# Patient Record
Sex: Male | Born: 1970
Health system: Southern US, Community
[De-identification: ages and names within clinical notes are randomized; demographics above are authoritative.]

## PROBLEM LIST (undated history)

## (undated) DIAGNOSIS — R319 Hematuria, unspecified: Secondary | ICD-10-CM

## (undated) HISTORY — PX: NO PAST SURGERIES: SHX2092

---

## 2004-09-11 ENCOUNTER — Emergency Department (HOSPITAL_COMMUNITY): Admission: EM | Admit: 2004-09-11 | Discharge: 2004-09-11 | Payer: Self-pay | Admitting: Emergency Medicine

## 2004-12-15 ENCOUNTER — Emergency Department (HOSPITAL_COMMUNITY): Admission: EM | Admit: 2004-12-15 | Discharge: 2004-12-15 | Payer: Self-pay | Admitting: Emergency Medicine

## 2009-04-20 ENCOUNTER — Emergency Department (HOSPITAL_COMMUNITY): Admission: EM | Admit: 2009-04-20 | Discharge: 2009-04-20 | Payer: Self-pay | Admitting: Emergency Medicine

## 2010-06-22 LAB — COMPREHENSIVE METABOLIC PANEL
ALT: 47 U/L (ref 0–53)
AST: 39 U/L — ABNORMAL HIGH (ref 0–37)
Alkaline Phosphatase: 57 U/L (ref 39–117)
BUN: 17 mg/dL (ref 6–23)
CO2: 24 mEq/L (ref 19–32)
Creatinine, Ser: 1.55 mg/dL — ABNORMAL HIGH (ref 0.4–1.5)
GFR calc Af Amer: >60 mL/min (ref >60)
GFR calc non Af Amer: 50 mL/min — ABNORMAL LOW (ref >60)
Sodium: 136 mEq/L (ref 135–145)
Total Protein: 8.4 g/dL — ABNORMAL HIGH (ref 6.0–8.3)

## 2010-06-22 LAB — DIFFERENTIAL
Basophils Absolute: 0 10*3/uL (ref 0.0–0.1)
Eosinophils Relative: 8 % — ABNORMAL HIGH (ref 0–5)
Lymphocytes Relative: 28 % (ref 12–46)
Monocytes Absolute: 0.2 10*3/uL (ref 0.1–1.0)
Neutrophils Relative %: 58 % (ref 43–77)

## 2010-06-22 LAB — ACETAMINOPHEN LEVEL: Acetaminophen (Tylenol), Serum: <10 ug/mL — ABNORMAL LOW (ref 10–30)

## 2010-06-22 LAB — CBC
HCT: 40.8 % (ref 39.0–52.0)
MCV: 89.8 fL (ref 78.0–100.0)
RBC: 4.54 MIL/uL (ref 4.22–5.81)
WBC: 4.5 10*3/uL (ref 4.0–10.5)

## 2010-06-22 LAB — ETHANOL: Alcohol, Ethyl (B): 249 mg/dL — ABNORMAL HIGH (ref 0–10)

## 2010-06-22 LAB — SALICYLATE LEVEL: Salicylate Lvl: <4 mg/dL (ref 2.8–20.0)

## 2012-05-09 ENCOUNTER — Inpatient Hospital Stay (HOSPITAL_COMMUNITY)
Admission: EM | Admit: 2012-05-09 | Discharge: 2012-05-20 | DRG: 654 | Disposition: A | Discharge status: Home / Self Care | Payer: MEDICAID | Attending: Family Medicine | Admitting: Family Medicine

## 2012-05-09 ENCOUNTER — Encounter (HOSPITAL_COMMUNITY): Payer: Self-pay | Admitting: Emergency Medicine

## 2012-05-09 ENCOUNTER — Observation Stay (HOSPITAL_COMMUNITY): Payer: Self-pay

## 2012-05-09 DIAGNOSIS — R338 Other retention of urine: Secondary | ICD-10-CM

## 2012-05-09 DIAGNOSIS — N390 Urinary tract infection, site not specified: Secondary | ICD-10-CM | POA: Diagnosis present

## 2012-05-09 DIAGNOSIS — K029 Dental caries, unspecified: Secondary | ICD-10-CM | POA: Diagnosis present

## 2012-05-09 DIAGNOSIS — Y838 Other surgical procedures as the cause of abnormal reaction of the patient, or of later complication, without mention of misadventure at the time of the procedure: Secondary | ICD-10-CM | POA: Diagnosis not present

## 2012-05-09 DIAGNOSIS — D62 Acute posthemorrhagic anemia: Secondary | ICD-10-CM | POA: Diagnosis not present

## 2012-05-09 DIAGNOSIS — K567 Ileus, unspecified: Secondary | ICD-10-CM

## 2012-05-09 DIAGNOSIS — N179 Acute kidney failure, unspecified: Secondary | ICD-10-CM | POA: Diagnosis present

## 2012-05-09 DIAGNOSIS — K929 Disease of digestive system, unspecified: Secondary | ICD-10-CM | POA: Diagnosis not present

## 2012-05-09 DIAGNOSIS — R339 Retention of urine, unspecified: Secondary | ICD-10-CM | POA: Diagnosis present

## 2012-05-09 DIAGNOSIS — K9189 Other postprocedural complications and disorders of digestive system: Secondary | ICD-10-CM | POA: Diagnosis not present

## 2012-05-09 DIAGNOSIS — K0381 Cracked tooth: Secondary | ICD-10-CM | POA: Diagnosis present

## 2012-05-09 DIAGNOSIS — R319 Hematuria, unspecified: Secondary | ICD-10-CM

## 2012-05-09 DIAGNOSIS — E876 Hypokalemia: Secondary | ICD-10-CM | POA: Diagnosis not present

## 2012-05-09 DIAGNOSIS — I10 Essential (primary) hypertension: Secondary | ICD-10-CM | POA: Diagnosis present

## 2012-05-09 DIAGNOSIS — R0902 Hypoxemia: Secondary | ICD-10-CM | POA: Diagnosis not present

## 2012-05-09 DIAGNOSIS — Z23 Encounter for immunization: Secondary | ICD-10-CM

## 2012-05-09 DIAGNOSIS — N3289 Other specified disorders of bladder: Principal | ICD-10-CM | POA: Diagnosis present

## 2012-05-09 DIAGNOSIS — N5089 Other specified disorders of the male genital organs: Secondary | ICD-10-CM | POA: Diagnosis not present

## 2012-05-09 DIAGNOSIS — K560 Paralytic ileus: Secondary | ICD-10-CM | POA: Diagnosis not present

## 2012-05-09 DIAGNOSIS — R000 Tachycardia, unspecified: Secondary | ICD-10-CM | POA: Diagnosis not present

## 2012-05-09 HISTORY — DX: Hematuria, unspecified: R31.9

## 2012-05-09 LAB — GRAM STAIN: Special Requests: NORMAL

## 2012-05-09 LAB — CBC WITH DIFFERENTIAL/PLATELET
Basophils Absolute: 0 10*3/uL (ref 0.0–0.1)
HCT: 37.2 % — ABNORMAL LOW (ref 39.0–52.0)
Hemoglobin: 12.9 g/dL — ABNORMAL LOW (ref 13.0–17.0)
MCH: 30 pg (ref 26.0–34.0)
MCHC: 34.7 g/dL (ref 30.0–36.0)
Platelets: 240 10*3/uL (ref 150–400)
RDW: 11.5 % (ref 11.5–15.5)

## 2012-05-09 LAB — URINE MICROSCOPIC-ADD ON

## 2012-05-09 LAB — URINALYSIS, ROUTINE W REFLEX MICROSCOPIC
Glucose, UA: 100 mg/dL — AB
Protein, ur: >300 mg/dL — AB
Specific Gravity, Urine: 1.028 (ref 1.005–1.030)
Specific Gravity, Urine: 1.018 (ref 1.005–1.030)
Urobilinogen, UA: 1 mg/dL (ref 0.0–1.0)
Urobilinogen, UA: 1 mg/dL (ref 0.0–1.0)
pH: 6 (ref 5.0–8.0)

## 2012-05-09 LAB — BASIC METABOLIC PANEL
CO2: 22 mEq/L (ref 19–32)
Calcium: 9.4 mg/dL (ref 8.4–10.5)
Chloride: 94 mEq/L — ABNORMAL LOW (ref 96–112)

## 2012-05-09 LAB — CREATININE, URINE, RANDOM: Creatinine, Urine: 113.78 mg/dL

## 2012-05-09 MED ORDER — OXYCODONE HCL 5 MG PO TABS
5.0000 mg | ORAL_TABLET | ORAL | Status: DC | PRN
  Administered 2012-05-09 – 2012-05-10 (×3): 5 mg via ORAL
  Filled 2012-05-09 (×3): qty 1

## 2012-05-09 MED ORDER — DEXTROSE 5 % IV SOLN
1.0000 g | Freq: Once | INTRAVENOUS | Status: AC
  Administered 2012-05-09: 1 g via INTRAVENOUS
  Filled 2012-05-09: qty 10

## 2012-05-09 MED ORDER — CARVEDILOL 3.125 MG PO TABS
3.1250 mg | ORAL_TABLET | Freq: Two times a day (BID) | ORAL | Status: DC
  Administered 2012-05-10: 3.125 mg via ORAL
  Filled 2012-05-09 (×3): qty 1

## 2012-05-09 MED ORDER — ACETAMINOPHEN 650 MG RE SUPP: 650.0000 mg | Freq: Four times a day (QID) | RECTAL | Status: DC | PRN

## 2012-05-09 MED ORDER — SODIUM CHLORIDE 0.9 % IV BOLUS (SEPSIS)
1000.0000 mL | Freq: Once | INTRAVENOUS | Status: AC
  Administered 2012-05-09: 1000 mL via INTRAVENOUS

## 2012-05-09 MED ORDER — ACETAMINOPHEN 325 MG PO TABS
650.0000 mg | ORAL_TABLET | Freq: Four times a day (QID) | ORAL | Status: DC | PRN
  Administered 2012-05-09: 650 mg via ORAL

## 2012-05-09 MED ORDER — DEXTROSE-NACL 5-0.45 % IV SOLN
INTRAVENOUS | Status: DC
  Administered 2012-05-09 – 2012-05-10 (×3): via INTRAVENOUS

## 2012-05-09 MED ORDER — DEXTROSE 5 % IV SOLN
1.0000 g | INTRAVENOUS | Status: AC
  Administered 2012-05-10 – 2012-05-11 (×2): 1 g via INTRAVENOUS
  Filled 2012-05-09 (×2): qty 10

## 2012-05-09 NOTE — ED Notes (Signed)
Pt transported to US

## 2012-05-09 NOTE — ED Provider Notes (Signed)
Medical screening examination/treatment/procedure(s) were performed by non-physician practitioner and as supervising physician I was immediately available for consultation/collaboration.  Flint Melter, MD 05/09/12 1655

## 2012-05-09 NOTE — H&P (Signed)
Family Medicine Teaching [facility] Admission History and Physical Service Pager: [phone]  Patient name: Adam Wagner Medical record number: [id number] Date of birth: 1970/07/30 Age: 42 y.o. Gender: male  Primary Care Provider: No primary provider on file.  Chief Complaint: "Couldn't pee"  Assessment and Plan: Josel Keo is a 42 y.o. year old male with no reported past medical history presenting with with one day history of urinary retention and found to have grossly bloody urine on cath in ED, additionally with tooth pain and swelling.  1.  AKI: patient with Cr of 3.00. Most recent Cr in system is 1.55 from 3 years ago. Given recent urinary retention must consider obstruction as a cause. Must also consider pre-renal causes with dehydration a possibility. Additionally could be related to intrinsic renal cause especially given proteinuria.  -Urology consulted by the ED physician, will follow-up their recs  -start on D5 1/2 NS at 100 mL/hr for rehydration given ketonuria on UA  -will recheck BMP in the am  -will likely check FeNa to further delineate pre-renal vs intrinsic renal causes   Will obtain at this time however will likely be unreliable, given Gross Blood in Urine, FeNA unreliable in gross hematuria  2.  Gross hematuria: found on cath today. Differential includes traumatic cath vs malignancy vs stone vs stricture vs glomerular cause vs infection.  -will obtain renal US  -will repeat UA, gram stain, and UCx for signs of infection on initial UA  -urology to see, appreciate their recs  -will continue ceftriaxone for concern for infection on UA-will narrow antibiotics with gram stain  and culture results     - reordered UA, Urine Culture and Gram stain  -will follow-up CBC in am given Hgb 12.9-borderline low  3.  Dental: patient with what appears to be a cracked tooth and adjacent swelling concerning for infection.  -will continue ceftriaxone for now, once this is  narrowed for urinary issues, could consider  adding on amoxacillin  -will order orthopantogram to evaluate swelling of left jaw  -will likely need OP dental f/u  4. High blood pressure: no history of HTN per patient, though BP is from 134-172/97-121.  -Will plan to follow BP  -start on coreg 3.125 mg BID  FEN/GI: NPO pending evaluation for further management with urologic intervention, D5 1/2 NS @ 100 mL/hr Prophylaxis: SCD's, no VTE pharmacologic prophylaxis given question of active urinary bleed Disposition: pending urologic evaluation and improved renal function  Code Status: full  History of Present Illness: Adam Wagner is a 42 y.o. year old male with no past medical history presenting with one day history of urinary retention and found to have grossly bloody urine on cath in ED. Patient states started yesterday while at a super bowl party. States he drank 3 beers and as the night went on he couldn't pee. States he tried straining but had pain with this and no urine would come out. States stomach was distended and painful throughout his abdomen. Denies any urinary dysfunction prior to last night. Last urination was Sunday morning he thinks. States has been drinking normally. Denies erectile difficulties, painful erections, painful ejaculation. Denies testicular and scrotal swelling. Denies any chemical exposure.  Additionally patient complains of left sided jaw pain for about one week. States he woke up with this last Monday and has tried salt water swish for this. States can't open mouth all the way due to swelling in left jaw. Not eating normally due to pain associated with this.  There  is no problem list on file for this patient.  Past Medical History: History reviewed. No pertinent past medical history. Past Surgical History: History reviewed. No pertinent past surgical history. Social History: History  Substance Use Topics  . Smoking status: Never Smoker   . Smokeless tobacco:  Not on file  . Alcohol Use: Yes  Lives in [place], works by [employer]  For any additional social history documentation, please refer to relevant sections of EMR.  Family History: Question of HTN, Had 2 aunts with breast cancer, and an uncle with unknown type of cancer. Allergies: No Known Allergies No current facility-administered medications on file prior to encounter.   No current outpatient prescriptions on file prior to encounter.   TAKING IBUPROFEN daily for Tooth Pain over past 3 weeks  Review Of Systems: Per HPI with the following additions: none Otherwise 12 point review of systems was performed and was unremarkable.  Physical Exam: BP 172/118  Pulse 106  Temp 98.4 F (36.9 C) (Oral)  Resp 18  SpO2 97% Exam: General: no acute distress, laying in bed, pleasant HEENT: MMM, left lower jaw posterior tooth with apparent crack in it, associated swelling and tenderness in left jaw at about the equivalent area as tooth Cardiovascular: tachy, reg rhythm, no mrg Respiratory: CTAB, no wheezes or crackles Abdomen: soft, mildly TTP throughout with most tenderness in suprapubic area, no rebound or guarding, no CVA tenderness Extremities: no edema Skin: abrasions over posterior aspect of right hand Neuro: alert, moving all extremities equally  Labs and Imaging:        Results for orders placed during the hospital encounter of 05/09/12 (from the past 24 hour(s))  URINALYSIS, ROUTINE W REFLEX MICROSCOPIC     Status: Abnormal   Collection Time   05/09/12 11:29 AM      Component Value Range   Color, Urine RED (*) YELLOW   APPearance TURBID (*) CLEAR   Specific Gravity, Urine 1.028  1.005 - 1.030   pH 5.5  5.0 - 8.0   Glucose, UA 100 (*) NEGATIVE mg/dL   Hgb urine dipstick LARGE (*) NEGATIVE   Bilirubin Urine LARGE (*) NEGATIVE   Ketones, ur 40 (*) NEGATIVE mg/dL   Protein, ur >409 (*) NEGATIVE mg/dL   Urobilinogen, UA 1.0  0.0 - 1.0 mg/dL   Nitrite POSITIVE  (*) NEGATIVE   Leukocytes, UA LARGE (*) NEGATIVE  URINE MICROSCOPIC-ADD ON     Status: Abnormal   Collection Time   05/09/12 11:29 AM      Component Value Range   Squamous Epithelial / LPF RARE  RARE   WBC, UA 11-20  <3 WBC/hpf   RBC / HPF TOO NUMEROUS TO COUNT  <3 RBC/hpf   Bacteria, UA FEW (*) RARE   Urine-Other LESS THAN 10 mL OF URINE SUBMITTED    CBC WITH DIFFERENTIAL     Status: Abnormal   Collection Time   05/09/12 11:45 AM      Component Value Range   WBC 9.5  4.0 - 10.5 K/uL   RBC 4.30  4.22 - 5.81 MIL/uL   Hemoglobin 12.9 (*) 13.0 - 17.0 g/dL   HCT 81.1 (*) 91.4 - 78.2 %   MCV 86.5  78.0 - 100.0 fL   MCH 30.0  26.0 - 34.0 pg   MCHC 34.7  30.0 - 36.0 g/dL   RDW 95.6  21.3 - 08.6 %   Platelets 240  150 - 400 K/uL   Neutrophils Relative 89 (*) 43 -  77 %   Neutro Abs 8.4 (*) 1.7 - 7.7 K/uL   Lymphocytes Relative 8 (*) 12 - 46 %   Lymphs Abs 0.8  0.7 - 4.0 K/uL   Monocytes Relative 3  3 - 12 %   Monocytes Absolute 0.3  0.1 - 1.0 K/uL   Eosinophils Relative 0  0 - 5 %   Eosinophils Absolute 0.0  0.0 - 0.7 K/uL   Basophils Relative 0  0 - 1 %   Basophils Absolute 0.0  0.0 - 0.1 K/uL  BASIC METABOLIC PANEL     Status: Abnormal   Collection Time   05/09/12 11:45 AM      Component Value Range   Sodium 133 (*) 135 - 145 mEq/L   Potassium 4.3  3.5 - 5.1 mEq/L   Chloride 94 (*) 96 - 112 mEq/L   CO2 22  19 - 32 mEq/L   Glucose, Bld 109 (*) 70 - 99 mg/dL   BUN 22  6 - 23 mg/dL   Creatinine, Ser 2.44 (*) 0.50 - 1.35 mg/dL   Calcium 9.4  8.4 - 01.0 mg/dL   GFR calc non Af Amer 24 (*) >90 mL/min   GFR calc Af Amer 28 (*) >90 mL/min    Marikay Alar, MD 05/09/2012, 4:28 PM   R2 Addendum: Pt seen and examined and agree with Dr. Purvis Sheffield above note with changes made in blue.  Briefly pt is a 42 yo male with no significant medical history who presented with an acute episode of urinary retention.  He underwent an uneventful catherization in the ED and was found to have gross  hematuria.  He was noted to have acute renal injury as well.  No Prior GU complaints.  He does report 3 weeks of L mandibular/tooth pain that he has been taking IBUPROFEN for on a regular basis but otherwise no medications; no reported drug use.    Pt is to be admitted to the hospital for further urologic evaluation and work up of acute renal failure; Imaging and Rocephin >>> Amoxicillin for tooth issues (further w/u as OP).  [place] of abdomen pending and Urine Studies although will likely be unreliable given gross blood.  Will start Coreg for BP given need to avoid nephrotoxic agents. Urology to see.  Andrena Mews, DO [facility] Resident - PGY-2 05/09/2012 6:15 PM

## 2012-05-09 NOTE — ED Notes (Signed)
MD at bedside. 

## 2012-05-09 NOTE — ED Notes (Signed)
Pt states he hasn't urinated in 24 hrs and he feels bloated. Pt states he had a cold and then developed left lower dental pain x1wk.

## 2012-05-09 NOTE — Progress Notes (Signed)
ANTIBIOTIC CONSULT NOTE - INITIAL  Pharmacy Consult for Ceftriaxone Indication: UTI & Dental Infection  No Known Allergies  Vital Signs: Temp: 98.4 F (36.9 C) (02/03 1318) Temp src: Oral (02/03 0947) BP: 172/118 mmHg (02/03 1600) Pulse Rate: 106  (02/03 1600)  Labs:  Basename 05/09/12 1145  WBC 9.5  HGB 12.9*  PLT 240  LABCREA --  CREATININE 3.00*   Estimated Creatinine Clearance: 37.3 ml/min (by C-G formula based on Cr of 3).   Microbiology: Recent Results (from the past 720 hour(s))  GRAM STAIN     Status: Normal   Collection Time   05/09/12  5:26 PM      Component Value Range Status Comment   Specimen Description URINE, RANDOM   Final    Special Requests Normal   Final    Gram Stain     Final    Value: WBC PRESENT,BOTH PMN AND MONONUCLEAR     NEGATIVE FOR BACTERIA     CYTOSPIN SLIDE   Report Status 05/09/2012 FINAL   Final     Assessment: 42 yo male with AKI being started on Ceftriaxone for possible UTI and dental infection. Received Ceftriaxone IV 1g in the ED earlier this afternoon. Scr 3.0, Tmax 98.4'F, WBC 9.5, urine culture sent.  Goal of Therapy:  Clinical improvement   Plan:  1) Ceftriaxone 1g IV  Q24 hours 2) F/u cultures, clinical status, renal function, LOT   Benjaman Pott, PharmD, BCPS 05/09/2012   6:52 PM

## 2012-05-09 NOTE — ED Notes (Signed)
Pt c/o right lower molar tooth pain and facial swelling. ALSO, c/o inability to urinate x 24 hrs.

## 2012-05-09 NOTE — ED Provider Notes (Signed)
History     CSN: [id number]  Arrival date & time 05/09/12  0940   First MD Initiated Contact with Patient 05/09/12 [phone]      Chief Complaint  Patient presents with  . Abscess  . Urinary Retention    (Consider location/radiation/quality/duration/timing/severity/associated sxs/prior treatment) HPI Comments: Patient is a 42 year old male with no significant past medical history who presents with urinary retention for the past 24 hours. Patient reports symptoms started gradually and progressively worsened since the onset. Patient reports drinking fluids normally but feels as though he is unable to urinate. He reports associated abdominal distension and discomfort. He has not tried anything for symptom relief. No aggravating/alleviating factors. Patient denies back pain, fever, NVD.    History reviewed. No pertinent past medical history.  History reviewed. No pertinent past surgical history.  No family history on file.  History  Substance Use Topics  . Smoking status: Never Smoker   . Smokeless tobacco: Not on file  . Alcohol Use: Yes      Review of Systems  Gastrointestinal: Positive for abdominal distention.  Genitourinary: Positive for decreased urine volume.  All other systems reviewed and are negative.    Allergies  Review of patient's allergies indicates no known allergies.  Home Medications   Current Outpatient Rx  Name  Route  Sig  Dispense  Refill  . IBUPROFEN 200 MG PO TABS   Oral   Take 800 mg by mouth every 6 (six) hours as needed. For tooth pain           BP 151/107  Pulse 111  Temp 98.4 F (36.9 C) (Oral)  Resp 18  SpO2 98%  Physical Exam  Nursing note and vitals reviewed. Constitutional: He is oriented to person, place, and time. He appears well-developed and well-nourished. No distress.  HENT:  Head: Normocephalic and atraumatic.  Eyes: Conjunctivae normal and EOM are normal. Pupils are equal, round, and reactive to light. No scleral  icterus.  Neck: Normal range of motion. Neck supple.  Cardiovascular: Normal rate and regular rhythm.  Exam reveals no gallop and no friction rub.   No murmur heard. Pulmonary/Chest: Effort normal and breath sounds normal. He has no wheezes. He has no rales. He exhibits no tenderness.  Abdominal: Soft. He exhibits distension. There is no tenderness. There is no rebound and no guarding.       Suprapubic distension.   Genitourinary: Prostate normal.       Prostate not boggy or tender per rectal exam.   Musculoskeletal: Normal range of motion.  Neurological: He is alert and oriented to person, place, and time. Coordination normal.       Speech is goal-oriented. Moves limbs without ataxia.   Skin: Skin is warm and dry.  Psychiatric: He has a normal mood and affect. His behavior is normal.    ED Course  Procedures (including critical care time)  Labs Reviewed  URINALYSIS, ROUTINE W REFLEX MICROSCOPIC - Abnormal; Notable for the following:    Color, Urine RED (*)  BIOCHEMICALS MAY BE AFFECTED BY COLOR   APPearance TURBID (*)     Glucose, UA 100 (*)     Hgb urine dipstick LARGE (*)     Bilirubin Urine LARGE (*)     Ketones, ur 40 (*)     Protein, ur >300 (*)     Nitrite POSITIVE (*)     Leukocytes, UA LARGE (*)     All other components within normal limits  CBC WITH  DIFFERENTIAL - Abnormal; Notable for the following:    Hemoglobin 12.9 (*)     HCT 37.2 (*)     Neutrophils Relative 89 (*)     Neutro Abs 8.4 (*)     Lymphocytes Relative 8 (*)     All other components within normal limits  BASIC METABOLIC PANEL - Abnormal; Notable for the following:    Sodium 133 (*)     Chloride 94 (*)     Glucose, Bld 109 (*)     Creatinine, Ser 3.00 (*)     GFR calc non Af Amer 24 (*)     GFR calc Af Amer 28 (*)     All other components within normal limits  URINE MICROSCOPIC-ADD ON - Abnormal; Notable for the following:    Bacteria, UA FEW (*)     All other components within normal limits   URINE CULTURE   No results found.   1. UTI (urinary tract infection)   2. Urinary retention       MDM  2:31 PM Foley catheter in place and patient is draining bloody urine. Patient feels some relief from abdominal distension. Urinalysis shows blood and UTI. Patient given 1g Rocephin. Patient has a creatinine of 3. Patient given fluids as well. I will consult urology.   3:11 PM Dr. Margarita Grizzle with Urology will see the patient. Family practice will admit the patient.        Emilia Beck, PA-C 05/09/12 1558

## 2012-05-10 ENCOUNTER — Encounter (HOSPITAL_COMMUNITY)
Admission: EM | Disposition: A | Discharge status: Home / Self Care | Payer: Self-pay | Source: Home / Self Care | Attending: Family Medicine

## 2012-05-10 ENCOUNTER — Inpatient Hospital Stay (HOSPITAL_COMMUNITY): Payer: Self-pay

## 2012-05-10 ENCOUNTER — Encounter (HOSPITAL_COMMUNITY): Payer: Self-pay | Admitting: Anesthesiology

## 2012-05-10 ENCOUNTER — Inpatient Hospital Stay (HOSPITAL_COMMUNITY): Payer: Self-pay | Admitting: Anesthesiology

## 2012-05-10 ENCOUNTER — Encounter (HOSPITAL_COMMUNITY): Payer: Self-pay | Admitting: General Practice

## 2012-05-10 DIAGNOSIS — N3289 Other specified disorders of bladder: Secondary | ICD-10-CM

## 2012-05-10 HISTORY — PX: LAPAROTOMY: SHX154

## 2012-05-10 LAB — CBC
HCT: 31.2 % — ABNORMAL LOW (ref 39.0–52.0)
MCH: 30.7 pg (ref 26.0–34.0)
MCHC: 35.6 g/dL (ref 30.0–36.0)
MCHC: 35.2 g/dL (ref 30.0–36.0)
MCV: 87 fL (ref 78.0–100.0)
Platelets: 235 10*3/uL (ref 150–400)
Platelets: 275 10*3/uL (ref 150–400)
RDW: 11.3 % — ABNORMAL LOW (ref 11.5–15.5)

## 2012-05-10 LAB — URINE CULTURE: Colony Count: NO GROWTH

## 2012-05-10 LAB — BASIC METABOLIC PANEL
BUN: 12 mg/dL (ref 6–23)
BUN: 9 mg/dL (ref 6–23)
CO2: 26 mEq/L (ref 19–32)
Calcium: 9.3 mg/dL (ref 8.4–10.5)
Calcium: 9.7 mg/dL (ref 8.4–10.5)
Chloride: 101 mEq/L (ref 96–112)
Creatinine, Ser: 1.35 mg/dL (ref 0.50–1.35)
GFR calc Af Amer: 73 mL/min — ABNORMAL LOW (ref >90)
GFR calc non Af Amer: 68 mL/min — ABNORMAL LOW (ref >90)
Glucose, Bld: 134 mg/dL — ABNORMAL HIGH (ref 70–99)

## 2012-05-10 LAB — SURGICAL PCR SCREEN
MRSA, PCR: NEGATIVE
Staphylococcus aureus: NEGATIVE

## 2012-05-10 LAB — PROTIME-INR: Prothrombin Time: 14.3 seconds (ref 11.6–15.2)

## 2012-05-10 SURGERY — LAPAROTOMY, EXPLORATORY
Anesthesia: General | Site: Abdomen | Wound class: Dirty or Infected

## 2012-05-10 MED ORDER — ONDANSETRON HCL 4 MG/2ML IJ SOLN
INTRAMUSCULAR | Status: DC | PRN
  Administered 2012-05-10: 4 mg via INTRAVENOUS

## 2012-05-10 MED ORDER — BUPIVACAINE HCL (PF) 0.25 % IJ SOLN
INTRAMUSCULAR | Status: DC | PRN
  Administered 2012-05-10: 30 mL

## 2012-05-10 MED ORDER — ONDANSETRON HCL 4 MG/2ML IJ SOLN
4.0000 mg | INTRAMUSCULAR | Status: DC | PRN
  Administered 2012-05-12 – 2012-05-16 (×3): 4 mg via INTRAVENOUS
  Filled 2012-05-10 (×4): qty 2

## 2012-05-10 MED ORDER — OXYCODONE HCL 5 MG PO TABS
5.0000 mg | ORAL_TABLET | ORAL | Status: DC | PRN
  Administered 2012-05-11 – 2012-05-16 (×3): 10 mg via ORAL
  Filled 2012-05-10 (×3): qty 2

## 2012-05-10 MED ORDER — DIATRIZOATE MEGLUMINE 30 % UR SOLN
Freq: Once | URETHRAL | Status: AC | PRN
  Administered 2012-05-10: 300 mL

## 2012-05-10 MED ORDER — OXYCODONE HCL 5 MG/5ML PO SOLN: 5.0000 mg | Freq: Once | ORAL | Status: DC | PRN

## 2012-05-10 MED ORDER — SODIUM CHLORIDE 0.9 % IV BOLUS (SEPSIS)
1000.0000 mL | Freq: Once | INTRAVENOUS | Status: AC
  Administered 2012-05-10: 1000 mL via INTRAVENOUS

## 2012-05-10 MED ORDER — CIPROFLOXACIN IN D5W 400 MG/200ML IV SOLN
400.0000 mg | Freq: Two times a day (BID) | INTRAVENOUS | Status: DC
  Filled 2012-05-10: qty 200

## 2012-05-10 MED ORDER — MIDAZOLAM HCL 5 MG/5ML IJ SOLN
INTRAMUSCULAR | Status: DC | PRN
  Administered 2012-05-10: 2 mg via INTRAVENOUS

## 2012-05-10 MED ORDER — 0.9 % SODIUM CHLORIDE (POUR BTL) OPTIME
TOPICAL | Status: DC | PRN
  Administered 2012-05-10 (×2): 1000 mL
  Administered 2012-05-10 (×2): 300 mL

## 2012-05-10 MED ORDER — OXYCODONE HCL 5 MG PO TABS: 5.0000 mg | ORAL_TABLET | Freq: Once | ORAL | Status: DC | PRN

## 2012-05-10 MED ORDER — LIDOCAINE HCL (CARDIAC) 20 MG/ML IV SOLN
INTRAVENOUS | Status: DC | PRN
  Administered 2012-05-10: 100 mg via INTRAVENOUS

## 2012-05-10 MED ORDER — IOHEXOL 300 MG/ML  SOLN
80.0000 mL | Freq: Once | INTRAMUSCULAR | Status: AC | PRN
  Administered 2012-05-10: 80 mL via INTRAVENOUS

## 2012-05-10 MED ORDER — CIPROFLOXACIN IN D5W 400 MG/200ML IV SOLN
INTRAVENOUS | Status: DC | PRN
  Administered 2012-05-10: 400 mg via INTRAVENOUS

## 2012-05-10 MED ORDER — ARTIFICIAL TEARS OP OINT
TOPICAL_OINTMENT | OPHTHALMIC | Status: DC | PRN
  Administered 2012-05-10: 1 via OPHTHALMIC

## 2012-05-10 MED ORDER — PHENYLEPHRINE HCL 10 MG/ML IJ SOLN
INTRAMUSCULAR | Status: DC | PRN
  Administered 2012-05-10 (×3): 160 ug via INTRAVENOUS

## 2012-05-10 MED ORDER — DIATRIZOATE MEGLUMINE 30 % UR SOLN: Freq: Once | URETHRAL | Status: DC | PRN

## 2012-05-10 MED ORDER — PROMETHAZINE HCL 25 MG/ML IJ SOLN
6.2500 mg | INTRAMUSCULAR | Status: DC | PRN
  Filled 2012-05-10: qty 1

## 2012-05-10 MED ORDER — TAMSULOSIN HCL 0.4 MG PO CAPS
0.4000 mg | ORAL_CAPSULE | Freq: Every day | ORAL | Status: DC
  Administered 2012-05-10 – 2012-05-20 (×11): 0.4 mg via ORAL
  Filled 2012-05-10 (×12): qty 1

## 2012-05-10 MED ORDER — SODIUM CHLORIDE 0.9 % IV SOLN
INTRAVENOUS | Status: DC
  Administered 2012-05-10 – 2012-05-11 (×2): via INTRAVENOUS
  Administered 2012-05-11: 1000 mL via INTRAVENOUS
  Administered 2012-05-12 – 2012-05-19 (×22): via INTRAVENOUS

## 2012-05-10 MED ORDER — DEXTROSE 5 % IV SOLN
INTRAVENOUS | Status: DC | PRN
  Administered 2012-05-10: 18:00:00 via INTRAVENOUS

## 2012-05-10 MED ORDER — HEPARIN SODIUM (PORCINE) 5000 UNIT/ML IJ SOLN
5000.0000 [IU] | Freq: Three times a day (TID) | INTRAMUSCULAR | Status: DC
  Administered 2012-05-10 – 2012-05-20 (×28): 5000 [IU] via SUBCUTANEOUS
  Filled 2012-05-10 (×32): qty 1

## 2012-05-10 MED ORDER — MORPHINE SULFATE 2 MG/ML IJ SOLN
2.0000 mg | INTRAMUSCULAR | Status: DC | PRN
  Administered 2012-05-11 (×3): 4 mg via INTRAVENOUS
  Administered 2012-05-11: 2 mg via INTRAVENOUS
  Administered 2012-05-11: 4 mg via INTRAVENOUS
  Administered 2012-05-12 (×2): 2 mg via INTRAVENOUS
  Administered 2012-05-13: 4 mg via INTRAVENOUS
  Administered 2012-05-13: 2 mg via INTRAVENOUS
  Administered 2012-05-13 (×3): 4 mg via INTRAVENOUS
  Administered 2012-05-14: 2 mg via INTRAVENOUS
  Administered 2012-05-14: 4 mg via INTRAVENOUS
  Administered 2012-05-14 – 2012-05-15 (×7): 2 mg via INTRAVENOUS
  Filled 2012-05-10: qty 1
  Filled 2012-05-10: qty 2
  Filled 2012-05-10: qty 1
  Filled 2012-05-10: qty 2
  Filled 2012-05-10 (×3): qty 1
  Filled 2012-05-10 (×2): qty 2
  Filled 2012-05-10: qty 1
  Filled 2012-05-10: qty 2
  Filled 2012-05-10 (×2): qty 1
  Filled 2012-05-10 (×3): qty 2
  Filled 2012-05-10 (×4): qty 1
  Filled 2012-05-10 (×2): qty 2

## 2012-05-10 MED ORDER — SUCCINYLCHOLINE CHLORIDE 20 MG/ML IJ SOLN
INTRAMUSCULAR | Status: DC | PRN
  Administered 2012-05-10: 100 mg via INTRAVENOUS

## 2012-05-10 MED ORDER — NEOSTIGMINE METHYLSULFATE 1 MG/ML IJ SOLN
INTRAMUSCULAR | Status: DC | PRN
  Administered 2012-05-10: 3 mg via INTRAVENOUS

## 2012-05-10 MED ORDER — LACTATED RINGERS IV SOLN
INTRAVENOUS | Status: DC | PRN
  Administered 2012-05-10 (×3): via INTRAVENOUS

## 2012-05-10 MED ORDER — SODIUM CHLORIDE 0.9 % IR SOLN
Status: DC | PRN
  Administered 2012-05-10: 3000 mL

## 2012-05-10 MED ORDER — PROPOFOL 10 MG/ML IV BOLUS
INTRAVENOUS | Status: DC | PRN
  Administered 2012-05-10: 200 mg via INTRAVENOUS

## 2012-05-10 MED ORDER — MEPERIDINE HCL 25 MG/ML IJ SOLN: 6.2500 mg | INTRAMUSCULAR | Status: DC | PRN

## 2012-05-10 MED ORDER — ALBUMIN HUMAN 5 % IV SOLN
INTRAVENOUS | Status: DC | PRN
  Administered 2012-05-10: 20:00:00 via INTRAVENOUS

## 2012-05-10 MED ORDER — AMLODIPINE BESYLATE 5 MG PO TABS
5.0000 mg | ORAL_TABLET | Freq: Every day | ORAL | Status: DC
  Administered 2012-05-10: 5 mg via ORAL
  Filled 2012-05-10 (×2): qty 1

## 2012-05-10 MED ORDER — GLYCOPYRROLATE 0.2 MG/ML IJ SOLN
INTRAMUSCULAR | Status: DC | PRN
  Administered 2012-05-10: 0.4 mg via INTRAVENOUS

## 2012-05-10 MED ORDER — HYDROMORPHONE HCL PF 1 MG/ML IJ SOLN
0.2500 mg | INTRAMUSCULAR | Status: DC | PRN
  Administered 2012-05-10 (×2): 0.5 mg via INTRAVENOUS

## 2012-05-10 MED ORDER — ONDANSETRON 8 MG/NS 50 ML IVPB
8.0000 mg | Freq: Once | INTRAVENOUS | Status: AC
  Administered 2012-05-10: 8 mg via INTRAVENOUS
  Filled 2012-05-10: qty 8

## 2012-05-10 MED ORDER — ROCURONIUM BROMIDE 100 MG/10ML IV SOLN
INTRAVENOUS | Status: DC | PRN
  Administered 2012-05-10 (×2): 10 mg via INTRAVENOUS
  Administered 2012-05-10: 20 mg via INTRAVENOUS
  Administered 2012-05-10: 10 mg via INTRAVENOUS
  Administered 2012-05-10: 40 mg via INTRAVENOUS

## 2012-05-10 MED ORDER — FENTANYL CITRATE 0.05 MG/ML IJ SOLN
INTRAMUSCULAR | Status: DC | PRN
  Administered 2012-05-10: 50 ug via INTRAVENOUS
  Administered 2012-05-10: 150 ug via INTRAVENOUS
  Administered 2012-05-10: 50 ug via INTRAVENOUS
  Administered 2012-05-10: 100 ug via INTRAVENOUS
  Administered 2012-05-10: 50 ug via INTRAVENOUS

## 2012-05-10 SURGICAL SUPPLY — 58 items
BAG URO CATCHER STRL LF (DRAPE) ×2 IMPLANT
BLADE SURG ROTATE 9660 (MISCELLANEOUS) IMPLANT
CANISTER SUCTION 2500CC (MISCELLANEOUS) ×2 IMPLANT
CATH FOLEY 2WAY SLVR  5CC 20FR (CATHETERS) ×2
CATH FOLEY 2WAY SLVR 5CC 20FR (CATHETERS) ×2 IMPLANT
CATH URET WHISTLE 5FR 28IN (CATHETERS) ×2 IMPLANT
CHLORAPREP W/TINT 26ML (MISCELLANEOUS) ×2 IMPLANT
CLOTH BEACON ORANGE TIMEOUT ST (SAFETY) ×2 IMPLANT
COVER MAYO STAND STRL (DRAPES) IMPLANT
COVER SURGICAL LIGHT HANDLE (MISCELLANEOUS) ×2 IMPLANT
DRAPE C-ARM 42X72 X-RAY (DRAPES) ×2 IMPLANT
DRAPE LAPAROSCOPIC ABDOMINAL (DRAPES) ×2 IMPLANT
DRAPE UTILITY 15X26 W/TAPE STR (DRAPE) ×4 IMPLANT
DRAPE WARM FLUID 44X44 (DRAPE) ×2 IMPLANT
ELECT BLADE 6.5 EXT (BLADE) ×2 IMPLANT
ELECT CAUTERY BLADE 6.4 (BLADE) ×2 IMPLANT
ELECT REM PT RETURN 9FT ADLT (ELECTROSURGICAL) ×2
ELECTRODE REM PT RTRN 9FT ADLT (ELECTROSURGICAL) ×1 IMPLANT
EVACUATOR SILICONE 100CC (DRAIN) ×2 IMPLANT
GLOVE BIO SURGEON STRL SZ7 (GLOVE) ×6 IMPLANT
GLOVE BIO SURGEON STRL SZ8 (GLOVE) ×2 IMPLANT
GLOVE BIOGEL PI IND STRL 7.5 (GLOVE) ×1 IMPLANT
GLOVE BIOGEL PI IND STRL 8 (GLOVE) ×1 IMPLANT
GLOVE BIOGEL PI INDICATOR 7.5 (GLOVE) ×1
GLOVE BIOGEL PI INDICATOR 8 (GLOVE) ×1
GOWN PREVENTION PLUS XLARGE (GOWN DISPOSABLE) ×2 IMPLANT
GOWN STRL NON-REIN LRG LVL3 (GOWN DISPOSABLE) ×4 IMPLANT
KIT BASIN OR (CUSTOM PROCEDURE TRAY) ×2 IMPLANT
KIT ROOM TURNOVER OR (KITS) ×2 IMPLANT
LIGASURE IMPACT 36 18CM CVD LR (INSTRUMENTS) IMPLANT
NEEDLE 22X1 1/2 (OR ONLY) (NEEDLE) ×2 IMPLANT
NS IRRIG 1000ML POUR BTL (IV SOLUTION) ×4 IMPLANT
PACK GENERAL/GYN (CUSTOM PROCEDURE TRAY) ×2 IMPLANT
PAD ARMBOARD 7.5X6 YLW CONV (MISCELLANEOUS) ×2 IMPLANT
PAD SHARPS MAGNETIC DISPOSAL (MISCELLANEOUS) IMPLANT
SPECIMEN JAR X LARGE (MISCELLANEOUS) IMPLANT
SPONGE GAUZE 4X4 12PLY (GAUZE/BANDAGES/DRESSINGS) ×2 IMPLANT
SPONGE LAP 18X18 X RAY DECT (DISPOSABLE) IMPLANT
STAPLER VISISTAT 35W (STAPLE) ×2 IMPLANT
SUCTION POOLE TIP (SUCTIONS) ×2 IMPLANT
SUT PDS AB 1 TP1 96 (SUTURE) ×6 IMPLANT
SUT SILK 2 0 SH CR/8 (SUTURE) ×2 IMPLANT
SUT SILK 2 0 TIES 10X30 (SUTURE) ×2 IMPLANT
SUT SILK 3 0 SH CR/8 (SUTURE) ×2 IMPLANT
SUT SILK 3 0 TIES 10X30 (SUTURE) ×2 IMPLANT
SUT VIC AB 2-0 SH 27 (SUTURE) ×4
SUT VIC AB 2-0 SH 27X BRD (SUTURE) ×4 IMPLANT
SUT VIC AB 2-0 UR6 27 (SUTURE) ×6 IMPLANT
SUT VIC AB 3-0 SH 27 (SUTURE) ×1
SUT VIC AB 3-0 SH 27XBRD (SUTURE) ×1 IMPLANT
SUT VICRYL 0 UR6 27IN ABS (SUTURE) ×4 IMPLANT
SYR BULB IRRIGATION 50ML (SYRINGE) IMPLANT
SYR CONTROL 10ML LL (SYRINGE) ×2 IMPLANT
TOWEL OR 17X24 6PK STRL BLUE (TOWEL DISPOSABLE) ×2 IMPLANT
TOWEL OR 17X26 10 PK STRL BLUE (TOWEL DISPOSABLE) ×2 IMPLANT
TRAY FOLEY CATH 14FRSI W/METER (CATHETERS) IMPLANT
WATER STERILE IRR 1000ML POUR (IV SOLUTION) IMPLANT
YANKAUER SUCT BULB TIP NO VENT (SUCTIONS) IMPLANT

## 2012-05-10 NOTE — Consult Note (Addendum)
Urology Consult  Requesting provider:  Dr. Mancel Bale. Dr. Doralee Albino  CC: Urinary retention. Gross hematuria.  HPI: 42 year old male presented to the ER with several problems yesterday. He is concerned that he has a tooth abscess and he has not had a bowel movement in about one week.  I have been consulted for gross hematuria. This began yesterday. It followed placement of foley catheter for urinary retention. He has never had gross hematuria before. It is dark, the color of merlot wine. There are no clots in the tubing, but small clots have formed in the collection bag. He states there was no difficulty placing the catheter. He denies a history of smoking, exposure to 2nd hand smoke/chemicals/chemotherapy/rubber/dye. He has no history of prostate cancer or renal cancer. Nothing seems to make his hematuria better or worse. There was concern for infection based on his UA when presenting to the hospital, and a urine culture is pending. Today we discussed that causes for gross hematuria can be benign or malignant. I recommended a hematuria workup. This involves imaging of the upper urinary tract as well as cystoscopy. I explained that, ideally, we would obtain upper tract imaging with contrasted cross-sectional imaging in order to fully evaluate his urinary system. I recommend waiting to see how his kidney function responds to hydration and drainage before obtaining any imaging. If his renal function stabilizes or improves it may be possible to obtain contrasted imaging during this hospitalization. He did improve slowly, he may need to get this as an outpatient. We discussed the risk and benefits of different imaging modalities and specifically the risk of contrast allergy. I explained cystoscopy and how was performed. I recommended that we do this once he is stabilized and his urine has cleared somewhat. We discussed the risk, benefits, and side effects of cystoscopy. I explained that there is no  imaging technique that can replace the detail gained by cystoscopy.  I have also been consulted for urinary retention. This began early yesterday morning. He was able to void on 05/08/12. He denies urgency, frequency, weak stream, or nocturia. Nothing seems to have made the retention better or worse. It was not associated with fever or chills. This has never happened before. A foley catheter was placed and he has had close to 3 liters of urine output since that time. He estimates his period of retention lasted approximately 12 hours.  He has renal failure. It looks like he may have insufficiency at baseline with a serum Cr of 1.5 according to hospital records. Serum Cr on presentation was 3. Renal US yesterday showed no hydronephrosis or renal masses. He states that he has been taking ibuprofen for an extended period (>1 week) for his tooth pain.   PMH: History reviewed. No pertinent past medical history.  PSH: History reviewed. No pertinent past surgical history.  Allergies: No Known Allergies  Medications: Prescriptions prior to admission  Medication Sig Dispense Refill  . ibuprofen (ADVIL,MOTRIN) 200 MG tablet Take 800 mg by mouth every 6 (six) hours as needed. For tooth pain         Social History: History   Social History  . Marital Status: Single    Spouse Name: N/A    Number of Children: N/A  . Years of Education: N/A   Occupational History  . Not on file.   Social History Main Topics  . Smoking status: Never Smoker   . Smokeless tobacco: Not on file  . Alcohol Use: Yes  .  Drug Use: No  . Sexually Active:    Other Topics Concern  . Not on file   Social History Narrative  . No narrative on file    Family History: No family history on file.  Review of Systems: Positive: Constipation, tooth pain. Negative: Fever, chest pain, or SOB.  A further 10 point review of systems was negative except what is listed in the HPI.  Physical Exam: Filed Vitals:   05/10/12  0600  BP: 165/101  Pulse: 100  Temp: 98.8 F (37.1 C)  Resp: 18    General: No acute distress.  Awake. Head:  Normocephalic.  Atraumatic. ENT:  EOMI.  Mucous membranes moist Neck:  Supple.  No lymphadenopathy. CV:  S1 present. S2 present. Regular rate. Pulmonary: Equal effort bilaterally.  Clear to auscultation bilaterally. Abdomen: Soft.  Mildly diffusely tender to palpation. Mildly distended. Negative rebound TTP. Skin:  Normal turgor.  No visible rash. Extremity: No gross deformity of bilateral upper extremities.  No gross deformity of    bilateral lower extremities.  Penis:  Uncircumcised.  No lesions.  Urethra: Foley catheter in place.  Draining tea-merlot colored urine. No blood at meatus. Orthotopic meatus. Scrotum: No lesions.  No ecchymosis.  No erythema. Testicles: Descended bilaterally.  No masses bilaterally. Epididymis: Palpable bilaterally.  Non Tender to palpation.  Studies:  Recent Labs  Basename 05/09/12 1145   HGB 12.9*   WBC 9.5   PLT 240    Recent Labs  Basename 05/09/12 1145   NA 133*   K 4.3   CL 94*   CO2 22   BUN 22   CREATININE 3.00*   CALCIUM 9.4   GFRNONAA 24*   GFRAA 28*     No results found for this basename: PT:2,INR:2,APTT:2 in the last 72 hours   No components found with this basename: ABG:2    Assessment:  Gross hematuria. Urinary retention. Renal failure.  Plan: -Follow up urine cultures. Treat empirically with renally adjusted antibiotics.  -Continue foley catheter. Will start tamsulosin 0.4mg  PO QHS. I discussed the risks, benefits, and side effects with the patient.  -Follow renal function. Possible factors include  ATN from use of NSAIDs, postobstructive, or combination.  -Watch for post-obstructive diuresis. Replace with IV fluids as needed. Consider increasing current IV fluid rate (currently 100 cc/hr).  -If urine begins to clear then we can try voiding trial. If urine continues to stay the same color a void a  voiding trial until it is cleared more.  -Will continue to follow. Please avoid any renal imaging at this point. If his renal function improves we may obtain a contrasted CT scan. If it does not improve but started to trend towards improvement we may delay CT scan as an outpatient so that the optimum imaging can be obtained. I will also schedule him to follow up with me as an outpatient for cystoscopy.   ADDENDUM: Serum creatinine returned at 1.3 which is an improvement. However recommend continued hydration and recheck his creatinine again tomorrow morning. If it remains stable or lower, this should be sufficient to obtain cross sectional contrasted imaging to get a better idea of the source of his bleeding.  Pager: [phone]    CC: Dr. Mancel Bale. Dr. Doralee Albino

## 2012-05-10 NOTE — Anesthesia Procedure Notes (Signed)
Procedure Name: Intubation Date/Time: 05/10/2012 5:40 PM Performed by: Tyrone Nine Pre-anesthesia Checklist: Patient identified, Timeout performed, Suction available, Patient being monitored and Emergency Drugs available Patient Re-evaluated:Patient Re-evaluated prior to inductionOxygen Delivery Method: Circle system utilized Preoxygenation: Pre-oxygenation with 100% oxygen Intubation Type: IV induction and Rapid sequence Ventilation: Mask ventilation without difficulty Grade View: Grade I Number of attempts: 1 Airway Equipment and Method: Video-laryngoscopy and Stylet Placement Confirmation: ETT inserted through vocal cords under direct vision,  positive ETCO2 and breath sounds checked- equal and bilateral Secured at: 23 cm Tube secured with: Tape Dental Injury: Teeth and Oropharynx as per pre-operative assessment  Difficulty Due To: Difficulty was anticipated and Difficult Airway- due to limited oral opening Comments: Video Laryngoscopy utilized due to tooth abscess w/ facial swelling.  Pt was able to open airway 1cm

## 2012-05-10 NOTE — Progress Notes (Signed)
Seen, examined early this morning and discussed in team rounds.  Agree with Dr. Felipa Emory management and documentation.  I am also aware of the CT finding suggestive of ruptured bladder.  Urology aware and we are taking appropriate supportive measures along with preparing for possible surgery.

## 2012-05-10 NOTE — Progress Notes (Signed)
Family Medicine Teaching Service Daily Progress Note Service Page: [phone]  Subjective:  Tooth pain improved with pain meds but continued. Feels like his abdomen is tight and a little tender, compares it to when he pulled a muscle playing football.   Objective: Temp:  [97.2 F (36.2 C)-98.8 F (37.1 C)] 98.8 F (37.1 C) (02/04 0600) Pulse Rate:  [100-115] 100  (02/04 0600) Resp:  [18] 18  (02/04 0600) BP: (134-179)/(97-121) 165/101 mmHg (02/04 0600) SpO2:  [97 %-99 %] 97 % (02/04 0600) Weight:  [220 lb (99.791 kg)] 220 lb (99.791 kg) (02/03 1835) Exam: Gen: NAD, alert, cooperative with exam HEENT: NCAT, EOMI, left lower jaw posterior tooth with apparent crack in it, associated swelling and tenderness in left jaw at about the equivalent area as tooth Neck: tenderness to palpation but no palpable lymph nodes in L cervical chain.  CV: RRR, good S1/S2, no murmur Resp: CTABL, no wheezes, non-labored Abd: tense, tender throughout to palpation but greatest in suprapubic area.  Ext: No edema, warm Neuro: Alert and oriented, No gross deficits   I have reviewed the patient's medications, labs, imaging, and diagnostic testing.  Notable results are summarized below.  CBC BMET   Lab 05/10/12 0540 05/09/12 1145  WBC 7.5 9.5  HGB 11.1* 12.9*  HCT 31.2* 37.2*  PLT 235 240    Lab 05/10/12 0540 05/09/12 1145  NA 136 133*  K 3.6 4.3  CL 101 94*  CO2 26 22  BUN 12 22  CREATININE 1.35 3.00*  GLUCOSE 113* 109*  CALCIUM 9.3 9.4     Imaging/Diagnostic Tests: DG Orthopantogram 05/09/2012 IMPRESSION:  The cracked tooth may involve the posterior left mandibular molar  as the crown appears irregular.  Complete Abd [place] 05/09/2012 IMPRESSION:  Grossly unremarkable kidneys.  Nonvisualization of aorta and pancreas.  Small to moderate amount ascites.  Question of hepatic nodularity was raised by the performing  technologist though this is not definitely confirmed on the  obtained images; if  there is clinical concern for cirrhosis  consider CT imaging with contrast.   Plan: Adam Wagner is a 42 y.o. year old male with no reported past medical history presenting with with one day history of urinary retention and found to have grossly bloody urine on cath in ED, additionally with tooth pain and swelling.   AKI - Cr of 3.00 on admission to 1.35 today . Up from 1.55 3 years ago.  - With recent urinary retention must consider obstruction as a cause. Also consider pre-renal causes with dehydration a possibility and intrinsic renal cause especially given proteinuria, also ATN from NSAID use.  - Urology consulting, appreciate reccs  - Avoid NSAIDs and other nephrotoxic agents as possible - Increase D5 1/2 NS to 125 mL/hr - FeNa 2.6 indicating intrinsic renal pathology, although this may be unreliable in gross hematuria  Gross hematuria:  - New onset after cath in the ED - Differential includes traumatic cath vs malignancy vs stone vs stricture vs glomerular cause vs infection.  - renal US not indicative of obstructive cause  - repeat UA shows positive nitrites, large LE, large Hgb, gram stain neg for bact, UCx pending - urology consulting- appreciate their reccs  - Increased hydration, monitor for post obstructive diuresis,   - Voiding trial if urine clears, start flomax  - CT and cystoscopy depending on renal function improvement.  - With improvement of renal function will consider CT with contrast per urology's request - Continue Rocephin, narrow as cultures return -  Hgb down from 12.9 to 11.1 overnight   Dental pain - cracked tooth and adjacent swelling concerning for infection.  - continue ceftriaxone, once this is narrowed for urinary issues, could consider adding on amoxacillin  - orthopantogram shows possible involvement of posterior L molar - will likely need OP dental f/u  - Added oxycodone IR 5 mg Q6 hrs PRN  High blood pressure:  - no history of HTN per patient  but BP elevated to 134-172/97-121.  - follow BP  - started on coreg 3.125 mg BID   FEN/GI: Regular diet, D5 1/2 NS @ 125 mL/hr  Prophylaxis: SCD's, no VTE pharmacologic prophylaxis given question of active urinary bleed  Disposition: pending urologic evaluation and improved renal function  Code Status: full   Kevin Fenton, MD 05/10/2012, 9:22 AM

## 2012-05-10 NOTE — Op Note (Signed)
Urology Operative Report  Date of Procedure: 05/10/12  Surgeon: Natalia Leatherwood, MD Assistant: Dr. Janee Morn (General Surgery)  Preoperative Diagnosis: Intraperitoneal bladder rupture Postoperative Diagnosis:  Same  Procedure(s): Cystogram Cystoscopy Cystorrhaphy Exploratory laparotomy  Estimated blood loss: 25cc  Specimen: None  Drains: Foley (20Fr), JP drain.  Complications: None  Findings: Intraperitoneal bladder rupture. 3 cm posterior bladder dome bladder rupture. Negative bowel injury. Large amount of clot in the peritoneum and bladder.  History of present illness: 42 year old gentleman who presented to the with urinary retention. Foley catheter was placed with return of bloody urine. During workup for hematuria he was found to have a bladder perforation which was felt to intraperitoneal. After discussing risk and benefits he elected to proceed with exploratory laparotomy and cystoscopy.   Procedure in detail: After informed consent was obtained, the patient was taken to the operating room. They were placed in the supine position. SCDs were turned on and in place. IV antibiotics were infused, and general anesthesia was induced. A timeout was performed in which the correct patient, surgical site, and procedure were identified and agreed upon by the team.  The patient was placed in a dorsolithotomy position, making sure to pad all pertinent neurovascular pressure points. The hair was removed from his anterior abdomen and pelvis. The abdomen from the xiphoid process down to the genitals were prepped and draped in the usual sterile fashion. A 20 French Foley catheter was placed into the urethra and inflated with 10 cc of sterile saline. A plain film was obtained of the pelvis. Next, Cystografin mixed with 100 cc of normal saline to give 400 total cc of fluid was used to fill the bladder by gravity drainage. A second plain film x-ray was obtained showing an intraperitoneal  perforation. The bladder was drained and then a third film was obtained which confirmed intraperitoneal perforation. The Foley catheter was removed and a cystoscope was advanced through the urethra into the bladder. This was a flexible cystoscope. There was too much clot to fully evaluate inside the bladder. I felt that open exploration was indicated.  An incision was made with a 10 blade scalpel from the pubic symphysis to an area approximately 3 cm below the umbilicus. I was able to dissect down to the fascia with Bovie electrocautery. The midline of the rectus muscle was identified and the rectus muscle was split. The fascia was divided down to the pubic symphysis. The space of Retzius was entered. There was no free fluid in the space of Retzius. I then used the Bookwalter self-retaining retractor to retract the walls laterally of the rectus muscle. I then placed 2 stay sutures with a UR 6 Vicryl on either side of the anterior bladder wall. The bladder was then divided with Bovie electrocautery. Once entering the bladder there is a large amount of clot which was evacuated. There was also found to be bowel fat located in the bladder as well. All of the blood clot was evacuated. There was a large 3 cm laceration in the posterior bladder dome. I was able to pack this with a small 2 x 4 and evaluated rest of the bladder. There were no other perforations in the bladder. Both ureter orifices were divided C. to be effluxing clear yellow urine. The patient was given IV indigo carmine. I was able to evaluate the bladder perforation. This had clean edges that were not devascularized. I then closed this in 2 layers first the serosal and muscular layer with a running locking 2-0 Vicryl  suture. I then closed the mucosal layer with a running 3-0 Vicryl. The 2 x 4 was removed before complete closure. Next, I closed the anterior cystotomy in 2 layers using a running locking 2-0 Vicryl for the mucosa and muscular layer. I then  used a 2-0 Vicryl suture for running serosal and muscular layer. A 20 French Foley catheter was placed with 10 cc of sterile water in the balloon. After this there was found to be fluid in the peritoneal cavity. I located the ureters. I placed 2 Coker clamps across the rectus. I then used 0 silk ties to tie proximally and distally and then the rectus was divided with Metzenbaum scissors. The Coker clamps were removed. The anterior peritoneum along the lateral anterior walls was incised with Bovie electric cautery to drop the bladder. There was a large amount of blood clot in the peritoneum. This was removed. I also called Dr. Janee Morn with Gen. surgery to evaluate the bowel and run the bowel with me to ensure there was no injury. The small bowel and cecum and rectum were evaluated and not have any injury. The remaining fluid that could be identified was evacuated. I then placed a 71 [place] Blake drain in the space of Retzius. This was secured with a silk suture. The exit point was separate stab incision in the lower abdomen. I then closed the fascia with a looped #1 PDS. The wound is then irrigated with sterile normal saline. I then closed the superficial tissue with interrupted Vicryl sutures. I injected the wound with 30 cc of quarter percent Marcaine. Staples were then used to close the skin. The drain in the wound were dressed with sterile dressings. The JP drain was hooked to a bulb without suction. The Foley catheter was irrigated with a Toomey syringe and irrigated easily. This is placed to closed drainage by gravity. This completed the procedure. Anesthesia was reversed and the patient was placed back in a supine position. He was taken to the PACU in stable condition.  He will be taken to the step down unit bed tonight. If he does well tomorrow we will likely below be transferred back to the floor. He will be back in the care of the teaching service at [facility].

## 2012-05-10 NOTE — H&P (Signed)
Seen and examined, chart review.  Discussed with Dr. Berline Chough.  Agree with his documentation and management.  Briefly, 42 yo male who awoke with difficulty urinating and abd distention.  Came to ER, found to have a creat=3.0 and a foley was inserted which produced bloody urine with clots.  With overnight hydration, his creat has markedly improved.  Issues: 1. Obstructive uropathy is the cause of his acute kidney injury, acute only or acute on chronic?  Could all be secondary to hematuria with clot causing obstruction. 2. Hematuria.  Need further workup.  Has been taking some NSAIDS for tooth pain (max ibuprofen at single dose= 800 mg and max daily dose = 1600 mg per patient).  Infection is possible.  Neoplasm is in differential   With improved renal function, will obtain contrast CT today.  Also uro on board.  Anticipate cystoscopy in the near future.

## 2012-05-10 NOTE — Anesthesia Preprocedure Evaluation (Addendum)
Anesthesia Evaluation  Patient identified by MRN, date of birth, ID band Patient awake    Airway Mallampati: IV TM Distance: >3 FB   Mouth opening: Limited Mouth Opening Comment: Trismus from tooth abbcess Dental  (+) Teeth Intact   Pulmonary neg pulmonary ROS,  Recent cough, productive   Pulmonary exam normal       Cardiovascular negative cardio ROS  Rhythm:Regular Rate:Normal  10-May-2012 15:39:04 [facility] ROUTINE RECORD Normal sinus rhythm Left ventricular hypertrophy Nonspecific T wave abnormality Abnormal ECG 13mm/s 25mm/mV 100Hz  8.0.1 12SL 241 HD CID: 1 Referred by: BERG SONN   Neuro/Psych negative neurological ROS     GI/Hepatic negative GI ROS, Neg liver ROS,   Endo/Other  negative endocrine ROS  Renal/GU negative Renal ROSHeme in Urine difficulty passing urine.     Musculoskeletal negative musculoskeletal ROS (+)   Abdominal (+)  Abdomen: tender.    Peds  Hematology negative hematology ROS (+)   Anesthesia Other Findings Pt relays Abscess of lower molar Unable to open his mouth, but 1 cm  Reproductive/Obstetrics                        Anesthesia Physical Anesthesia Plan  ASA: I and emergent  Anesthesia Plan: General   Post-op Pain Management:    Induction: Intravenous, Rapid sequence and Cricoid pressure planned  Airway Management Planned: Oral ETT  Additional Equipment:   Intra-op Plan:   Post-operative Plan:   Informed Consent: I have reviewed the patients History and Physical, chart, labs and discussed the procedure including the risks, benefits and alternatives for the proposed anesthesia with the patient or authorized representative who has indicated his/her understanding and acceptance.   Dental advisory given and Dental Advisory Given  Plan Discussed with: CRNA, Anesthesiologist and Surgeon  Anesthesia Plan Comments:         Anesthesia Quick Evaluation

## 2012-05-10 NOTE — Anesthesia Postprocedure Evaluation (Signed)
  Anesthesia Post-op Note  Patient: Adam Wagner  Procedure(s) Performed: Procedure(s) (LRB) with comments: EXPLORATORY LAPAROTOMY (N/A) - exploratory laparotomy for bladder perforation, cystoscopy  Patient Location: PACU  Anesthesia Type:General  Level of Consciousness: awake, alert , oriented and patient cooperative  Airway and Oxygen Therapy: Patient Spontanous Breathing  Post-op Pain: mild  Post-op Assessment: Post-op Vital signs reviewed, Patient's Cardiovascular Status Stable, Respiratory Function Stable, Patent Airway, No signs of Nausea or vomiting and Pain level controlled  Post-op Vital Signs: stable  Complications: No apparent anesthesia complications

## 2012-05-10 NOTE — Progress Notes (Signed)
GU  Patient had CT this afternoon to evaluate for the source of his hematuria. This revealed a possible intraperitoneal bladder perforation. I recommended a cystogram to evaluate for this perforation. In the interim, the patient noted worsening of his abdominal fullness and pain in his abdomen. His blood pressure rose and his pulse pressures narrowed. He is maintaining a mild tachycardia. I reviewed the CT scan and the CT report. I explained these findings to the patient. I discussed with him the possibility of the bladder perforation. I explained to the patient I did not know why he had a perforation of his bladder. We discussed the possibility of trauma earlier today, and he dismissed this. Again he states that he has not had any recent trauma. We discussed the natural history of bladder perforation and I explained that doing nothing could result in death.I have recommended proceeding to the operating room for cystoscopy, cystogram, cystorrhaphy, and exploratory laparotomy. I explained to him the risks, benefits, and alternatives. I do not recommend avoiding surgery at this point, but I did give him the option to proceed with cystogram. We discussed the risk of surgery which include, but are not limited to, bleeding, infection, allergic reaction, heart attack, stroke, pneumonia, death, retinitis, sepsis, need to remain intubated, need for resection of small and/or large bowel, damage to contiguous structures, need for catheterization, need for continued intubation. The patient voiced understanding. At this point he would like to proceed to the operating room without a cystogram. The patient has been typed and crossed for 2 units of packed red blood cells, we have obtained a chest x-ray and an EKG. Coagulation studies obtained and these are normal. The patient's brother, Casimiro Needle, is at the bedside. Their questions were answered to their stated satisfaction.  Filed Vitals:   05/10/12 1616  BP: 179/115  Pulse:  100  Temp: 98.3 F (36.8 C)  Resp: 20   Gen: Awake, alert, in moderate distress. CV: Tachycardic, no murmurs Chest: CTA-B, no crackles Abd: Increased distention, diffusely TTP, negative rebound TTP GU: Foley in place, gross hematuria Ext: No c/c/e  A/P: Possible bladder perforation with continued gross hematuria and worsening abdominal pain. Although he does not have peritoneal signs at this point, I do not think that waiting until these develop would be wise. I obtained informed consent for cystoscopy, cystogram, cystorrhaphy, and exploratory laparotomy.  I have notified Dr. Janee Morn, the general surgeon on call, in case I should need his help.

## 2012-05-10 NOTE — Op Note (Signed)
Intraoperative consult I was asked by Dr. Margarita Grizzle to evaluate this patient in the operating room.Patient has suffered an intraperitoneal bladder rupture of unknown etiology. Dr. Margarita Grizzle has repaired that and asked me to consult in the operating room regarding evaluation of the bowel. I scrubbed in and Dr. Margarita Grizzle already had the abdomen open. His bladder repair appeared nicely intact. There is no evidence of fecal or enteric content free in the peritoneal cavity. I ran the small bowel from the ligament of Treitz down to the terminal ileum. While it was mildly dilated there were no injuries or abnormalities noted. The cecum appeared intact. The rectosigmoid and sigmoid colon were also intact. There were a few small blood clots that came from the bladder but no other abnormalities were noted.  Impression: no evidence of bowel injury. The patient remained in the operating room with Dr. Margarita Grizzle completing his procedure. Violeta Gelinas, MD, MPH, FACS Pager: [phone]

## 2012-05-10 NOTE — Transfer of Care (Signed)
Immediate Anesthesia Transfer of Care Note  Patient: Adam Wagner  Procedure(s) Performed: Procedure(s) (LRB) with comments: EXPLORATORY LAPAROTOMY (N/A) - exploratory laparotomy for bladder perforation, cystoscopy  Patient Location: PACU  Anesthesia Type:General  Level of Consciousness: awake and alert   Airway & Oxygen Therapy: Patient Spontanous Breathing and Patient connected to nasal cannula oxygen  Post-op Assessment: Report given to PACU RN and Post -op Vital signs reviewed and stable  Post vital signs: Reviewed and stable  Complications: No apparent anesthesia complications

## 2012-05-10 NOTE — Preoperative (Signed)
Beta Blockers   Reason not to administer Beta Blockers:Not Applicable 
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
## 2012-05-11 ENCOUNTER — Encounter (HOSPITAL_COMMUNITY): Payer: Self-pay | Admitting: Urology

## 2012-05-11 DIAGNOSIS — R338 Other retention of urine: Secondary | ICD-10-CM

## 2012-05-11 DIAGNOSIS — K029 Dental caries, unspecified: Secondary | ICD-10-CM

## 2012-05-11 DIAGNOSIS — R259 Unspecified abnormal involuntary movements: Secondary | ICD-10-CM

## 2012-05-11 DIAGNOSIS — K560 Paralytic ileus: Secondary | ICD-10-CM

## 2012-05-11 DIAGNOSIS — R319 Hematuria, unspecified: Secondary | ICD-10-CM

## 2012-05-11 DIAGNOSIS — K929 Disease of digestive system, unspecified: Secondary | ICD-10-CM

## 2012-05-11 DIAGNOSIS — K0401 Reversible pulpitis: Secondary | ICD-10-CM

## 2012-05-11 DIAGNOSIS — N3289 Other specified disorders of bladder: Principal | ICD-10-CM

## 2012-05-11 LAB — BASIC METABOLIC PANEL
CO2: 24 mEq/L (ref 19–32)
Calcium: 8.8 mg/dL (ref 8.4–10.5)
GFR calc Af Amer: 75 mL/min — ABNORMAL LOW (ref >90)
GFR calc non Af Amer: 65 mL/min — ABNORMAL LOW (ref >90)
Sodium: 135 mEq/L (ref 135–145)

## 2012-05-11 LAB — CBC
Platelets: 252 10*3/uL (ref 150–400)
RBC: 3.88 MIL/uL — ABNORMAL LOW (ref 4.22–5.81)
WBC: 7.6 10*3/uL (ref 4.0–10.5)

## 2012-05-11 MED ORDER — AMLODIPINE BESYLATE 10 MG PO TABS
10.0000 mg | ORAL_TABLET | Freq: Every day | ORAL | Status: DC
  Administered 2012-05-11 (×2): 5 mg via ORAL
  Administered 2012-05-12 – 2012-05-20 (×9): 10 mg via ORAL
  Filled 2012-05-11 (×11): qty 1

## 2012-05-11 MED ORDER — POLYETHYLENE GLYCOL 3350 17 G PO PACK
17.0000 g | PACK | Freq: Every day | ORAL | Status: DC | PRN
  Filled 2012-05-11: qty 1

## 2012-05-11 MED ORDER — HYDRALAZINE HCL 20 MG/ML IJ SOLN
5.0000 mg | INTRAMUSCULAR | Status: DC | PRN
  Administered 2012-05-11 – 2012-05-13 (×4): 5 mg via INTRAVENOUS
  Filled 2012-05-11 (×4): qty 0.25
  Filled 2012-05-11: qty 1
  Filled 2012-05-11 (×2): qty 0.25

## 2012-05-11 MED ORDER — PENICILLIN V POTASSIUM 250 MG PO TABS
500.0000 mg | ORAL_TABLET | Freq: Four times a day (QID) | ORAL | Status: DC
  Administered 2012-05-11 – 2012-05-13 (×7): 500 mg via ORAL
  Filled 2012-05-11 (×4): qty 2
  Filled 2012-05-11: qty 1
  Filled 2012-05-11: qty 2
  Filled 2012-05-11 (×2): qty 1
  Filled 2012-05-11 (×4): qty 2
  Filled 2012-05-11 (×2): qty 1
  Filled 2012-05-11: qty 2

## 2012-05-11 NOTE — Progress Notes (Signed)
Trauma Service Note  Subjective: Patient doing okay today.  Much less pain.  Urine is dark red with some clots this morning.  Apparently this different from when the Urologist saw the patient.  Objective: Vital signs in last 24 hours: Temp:  [98 F (36.7 C)-99.1 F (37.3 C)] 98.8 F (37.1 C) (02/05 0740) Pulse Rate:  [68-110] 82  (02/05 0740) Resp:  [14-22] 19  (02/05 0740) BP: (131-179)/(82-134) 164/104 mmHg (02/05 0740) SpO2:  [98 %-100 %] 99 % (02/05 0740)    Intake/Output from previous day: 02/04 0701 - 02/05 0700 In: 4400 [I.V.:4150; IV Piggyback:250] Out: 1626 [Urine:1560; Emesis/NG output:1; Drains:40; Blood:25] Intake/Output this shift: Total I/O In: 150 [I.V.:150] Out: -   General: No acute distress.  Patient cannot identify why this may have occurred.  Lungs: Clear  Abd: Distended, has hypoactive bowel sounds.  Extremities: No DVT signs or symptoms  Neuro: Intact  Lab Results: CBC   Basename 05/11/12 0640 05/10/12 1600  WBC 7.6 9.0  HGB 11.7* 13.0  HCT 33.7* 36.9*  PLT 252 275   BMET  Basename 05/11/12 0640 05/10/12 1600  NA 135 135  K 4.0 3.5  CL 101 98  CO2 24 26  GLUCOSE 123* 134*  BUN 10 9  CREATININE 1.33 1.28  CALCIUM 8.8 9.7   PT/INR  Basename 05/10/12 1600  LABPROT 14.3  INR 1.13   ABG No results found for this basename: PHART:2,PCO2:2,PO2:2,HCO3:2 in the last 72 hours  Studies/Results: Ct Abdomen Pelvis W Wo Contrast  05/10/2012  *RADIOLOGY REPORT*  Clinical Data: Gross hematuria, dysuria, abdominal bloating  CT ABDOMEN AND PELVIS WITHOUT AND WITH CONTRAST  Technique:  Multidetector CT imaging of the abdomen and pelvis was performed without contrast material in one or both body regions, followed by contrast material(s) and further sections in one or both body regions.  Contrast: 80mL OMNIPAQUE IOHEXOL 300 MG/ML  SOLN  Comparison: Ultrasound abdomen dated 05/09/2012  Findings: Small right and trace left pleural effusions.   Associated lower lobe opacities, likely atelectasis.  Scattered foci of free abdominal gas below the diaphragm (for example, series 2/images 19 and 29).  Liver, spleen, pancreas, and adrenal glands are within normal limits.  Gallbladder is remarkable, noting pericholecystic fluid/ascites. No intrahepatic or extrahepatic ductal dilatation.  Kidneys are within normal limits.  No renal, ureteral, or bladder calculi.  No hydronephrosis.  Generalized prominence of small and large bowel, suggesting adynamic ileus.  Normal appendix.  No evidence of abdominal aortic aneurysm.  Prostate is unremarkable.  Small volume complex abdominopelvic ascites/hemorrhage.  5.8 x 6.6 x 3.9 cm hematoma superior/posterior to the bladder (series 2/image 83).  Foley catheter with hemorrhage and gas within the bladder.  These findings are worrisome for intraperitoneal bladder rupture.  Delayed imaging through the bladder does not demonstrate active extravasation of excretory contrast.  Mild degenerative changes of the visualized thoracolumbar spine.  IMPRESSION: Hemorrhage within and adjacent to the bladder.  Scattered nondependent foci of free abdominal gas.  While delayed imaging of the bladder does not demonstrate active extravasation of excretory contrast, these findings are still highly suspicious for intraperitoneal bladder rupture.  Suspected secondary adynamic ileus.  These results were called by telephone on 05/10/2012 at 1512 hours to Dr Birdie Sons, who verbally acknowledged these results.   Original Report Authenticated By: Charline Bills, M.D.    Dg Orthopantogram  05/10/2012  *RADIOLOGY REPORT*  Clinical Data: Left-sided jaw pain and swelling.  Cracked tooth.  ORTHOPANTOGRAM/PANORAMIC  Comparison: None.  Findings: The cracked  tooth may involve the posterior left mandibular molar as the crown appears irregular.  No significant peri apical lucency is noted.  Teeth are missing bilaterally.  No findings of osteomyelitis of the  mandible.  If further delineation were clinically desired then CT may be considered.  IMPRESSION: The cracked tooth may involve the posterior left mandibular molar as the crown appears irregular.   Original Report Authenticated By: Lacy Duverney, M.D.    Dg Abd 1 View  05/10/2012  *RADIOLOGY REPORT*  Clinical Data: Bladder perforation.  DG C-ARM 1-60 MIN,ABDOMEN - 1 VIEW  Technique: Four C-arm views are submitted for review after surgery.  Comparison:  05/10/2012 CT.  Findings: With instillation of contrast, there is extravasation consistent with bladder perforation.  Lateral view was not obtained.  IMPRESSION: Findings consistent with bladder perforation.   Original Report Authenticated By: Lacy Duverney, M.D.    US Abdomen Complete  05/09/2012  *RADIOLOGY REPORT*  Clinical Data:  Abdominal distention, gross hematuria, elevated creatinine  ULTRASOUND ABDOMEN:  Technique:  Sonography of upper abdominal structures was performed.  Comparison:  None  Gallbladder:  Normally distended without stones or wall thickening. No pericholecystic fluid or sonographic Murphy sign.  Common bile duct:  Normal caliber 3 mm diameter  Liver:  Grossly normal echogenicity.  No definite focal hepatic mass lesion.  Hepatopetal portal venous flow.  Technologist performing exam felt liver was slightly nodular in appearance though this is not well demonstrated on the obtained images.  IVC:  Normal appearance  Pancreas:  Obscured by bowel gas  Spleen:  Normal appearance, 6.1 cm length  Right kidney:  11.6 cm length. Normal morphology without mass or hydronephrosis.  Left kidney:  10.7 cm length. Normal morphology without mass or hydronephrosis.  Aorta:  Predominately obscured due to bowel gas  Other:  Small to moderate amount of ascites is present.  IMPRESSION: Grossly unremarkable kidneys. Nonvisualization of aorta and pancreas. Small to moderate amount ascites. Question of hepatic nodularity was raised by the performing technologist though  this is not definitely confirmed on the obtained images; if there is clinical concern for cirrhosis consider CT imaging with contrast.   Original Report Authenticated By: Ulyses Southward, M.D.    Dg C-arm 1-60 Min  05/10/2012  *RADIOLOGY REPORT*  Clinical Data: Bladder perforation.  DG C-ARM 1-60 MIN,ABDOMEN - 1 VIEW  Technique: Four C-arm views are submitted for review after surgery.  Comparison:  05/10/2012 CT.  Findings: With instillation of contrast, there is extravasation consistent with bladder perforation.  Lateral view was not obtained.  IMPRESSION: Findings consistent with bladder perforation.   Original Report Authenticated By: Lacy Duverney, M.D.     Anti-infectives: Anti-infectives     Start     Dose/Rate Route Frequency Ordered Stop   05/10/12 2200   ciprofloxacin (CIPRO) IVPB 400 mg  Status:  Discontinued        400 mg 200 mL/hr over 60 Minutes Intravenous Every 12 hours 05/10/12 1813 05/10/12 2237   05/10/12 1300   cefTRIAXone (ROCEPHIN) 1 g in dextrose 5 % 50 mL IVPB        1 g 100 mL/hr over 30 Minutes Intravenous Every 24 hours 05/09/12 1936     05/09/12 1300   cefTRIAXone (ROCEPHIN) 1 g in dextrose 5 % 50 mL IVPB        1 g 100 mL/hr over 30 Minutes Intravenous  Once 05/09/12 1249 05/09/12 1349          Assessment/Plan: s/p Procedure(s): EXPLORATORY LAPAROTOMY  Advance diet Continue foley due to acute urinary retention and bladder perforation recently repaired Sips of clear liquids only.  LOS: 2 days   Marta Lamas. Gae Bon, MD, FACS [phone] Trauma Surgeon 05/11/2012

## 2012-05-11 NOTE — Progress Notes (Signed)
Urology Progress Note  Subjective:     No acute urologic events overnight. The patient is doing well following surgery. He continues to have some hypertension and mild tachycardia. He still has some abdominal distention, but he states his pain is actually improved. Negative flatus or bowel movements. I have encouraged him to ambulate. Catheter draining well with no problems.  I explained the surgical findings and the course of the surgery to the patient. I explained that I do not know the cause of his bladder perforation, but it appears that we caught it early.  ROS: Negative: Chest pain, SOB  Objective:  Patient Vitals for the past 24 hrs:  BP Temp Temp src Pulse Resp SpO2  05/11/12 0606 151/94 mmHg - - 91  17  100 %  05/11/12 0419 154/107 mmHg 98 F (36.7 C) Oral 110  20  98 %  05/11/12 0300 170/106 mmHg - - 92  15  100 %  05/11/12 0200 169/106 mmHg - - 81  15  100 %  05/11/12 0115 155/99 mmHg - - 88  15  99 %  05/11/12 0100 156/107 mmHg - - 92  19  98 %  05/11/12 0006 155/105 mmHg 98.7 F (37.1 C) Oral 106  17  100 %  05/11/12 0000 148/109 mmHg - - 87  15  100 %  05/10/12 2330 147/99 mmHg - - 79  14  100 %  05/10/12 2315 151/99 mmHg - - 68  15  100 %  05/10/12 2300 140/98 mmHg - - 83  17  99 %  05/10/12 2245 152/99 mmHg - - 88  18  99 %  05/10/12 2239 145/87 mmHg - - 78  16  100 %  05/10/12 2238 145/87 mmHg 99.1 F (37.3 C) Oral 71  15  98 %  05/10/12 2233 141/82 mmHg - - - - -  05/10/12 2215 - 98.9 F (37.2 C) - 75  18  100 %  05/10/12 2205 131/82 mmHg - - 71  15  99 %  05/10/12 2200 - - - 100  15  99 %  05/10/12 2150 132/88 mmHg - - 79  15  100 %  05/10/12 2145 - - - 75  21  100 %  05/10/12 2135 141/84 mmHg - - 73  22  99 %  05/10/12 2120 150/88 mmHg 98.6 F (37 C) - 84  17  100 %  05/10/12 1616 179/115 mmHg 98.3 F (36.8 C) Oral 100  20  100 %  05/10/12 1548 150/134 mmHg 98.6 F (37 C) Oral 96  20  100 %    Physical Exam: General:  No acute distress,  awake Cardiovascular:    [x]   S1/S2 present, RRR  []   Irregularly irregular Chest:  CTA-B, no crackles Abdomen:               []  Soft, appropriately TTP  []  Soft, NTTP  [x]  Soft, appropriately TTP, incision(s) dressed with minimal soakage, JP serosanguinous.  Genitourinary: Uncircumcised. Negative edema. Foley:  In position. Draining light pink-clear red without clots.    I/O last 3 completed shifts: In: 2219 [I.V.:2219] Out: 4876 [Urine:4875; Emesis/NG output:1]  Recent Labs  Basename 05/10/12 1600 05/10/12 0540   HGB 13.0 11.1*   WBC 9.0 7.5   PLT 275 235    Recent Labs  Basename 05/10/12 1600 05/10/12 0540   NA 135 136   K 3.5 3.6   CL 98 101   CO2 26 26  BUN 9 12   CREATININE 1.28 1.35   CALCIUM 9.7 9.3   GFRNONAA 68* 63*   GFRAA 78* 73*     Recent Labs  Basename 05/10/12 1600   INR 1.13   APTT 36     No components found with this basename: ABG:2    Length of stay: 2 days.  Assessment: Intraperitoneal bladder perforation. POD#1 Open cystorrhaphy and exploratory laparotomy   Plan: -Labs pending this morning. -Continue JP drain off bulb suction. -Continue foley catheter. -Ambulate with assist. -NPO with sips for meds until he has some return of bowel function. -Continue hydration. -Continue heparin and SCD's. -If he remains hemodynamically stable, he would likely be safe to transfer her back to the floor. -Will continue to follow.   Natalia Leatherwood, MD [phone]

## 2012-05-11 NOTE — Consult Note (Signed)
DENTAL CONSULTATION  Date of Consultation:  05/11/2012 Patient Name:   Adam Wagner Date of Birth:   1970-07-12 Medical Record Number: [id number]  VITALS: BP 148/98  Pulse 125  Temp 98.4 F (36.9 C) (Oral)  Resp 20  Ht 5\' 9"  (1.753 m)  Wt 220 lb (99.791 kg)  BMI 32.49 kg/m2  SpO2 95%   CHIEF COMPLAINT: Dental consultation requested  for evaluation of a cracked tooth.  HPI: Adam Wagner for 42 year old male referred for dental consultation.  Patient presented to the emergency department with a history of urinary retention and history of toothache symptoms involving the lower left molar. Patient subsequently admitted and placed on IV antibiotic therapy.   Dental consultation requested to evaluate lower left molar for treatment as indicated.  Patient provided a history of lower left molar pain for approximately one to 2 weeks. Patient had been in an intensity of 10 out of 10 but is currently 7/10 due to pain medication. Patient describes the pain as being sharp and last hours at a time. Pain with spontaneous as well.  Patient indicates that the pain is much better with current pain medication. Patient indicates he last saw a dentist approximately 2-3 years ago to have 2 teeth pulled by an Transport planner in [place]. Patient is unable to provide the name of the  oral surgeon. Patient does not seek regular dental care by his report.    PMH: Past Medical History  Diagnosis Date  . Hematuria 05/09/2012    PSH: Past Surgical History  Procedure Date  . No past surgeries   . Laparotomy 05/10/2012    Procedure: EXPLORATORY LAPAROTOMY;  Surgeon: Milford Cage, MD;  Location[facility];  Service: Urology;  Laterality: N/A;  exploratory laparotomy for bladder perforation, cystoscopy    ALLERGIES: No Known Allergies  MEDICATIONS: Current Facility-Administered Medications  Medication Dose Route Frequency Provider Last Rate Last Dose  . 0.9 %  sodium chloride infusion   Intravenous  Continuous Andrena Mews, DO 150 mL/hr at 05/11/12 1138 1,000 mL at 05/11/12 1138  . amLODipine (NORVASC) tablet 10 mg  10 mg Oral Daily Elenora Gamma, MD   5 mg at 05/11/12 1138  . heparin injection 5,000 Units  5,000 Units Subcutaneous Q8H Milford Cage, MD   5,000 Units at 05/11/12 1336  . hydrALAZINE (APRESOLINE) injection 5 mg  5 mg Intravenous Q4H PRN Elenora Gamma, MD   5 mg at 05/11/12 1204  . morphine 2 MG/ML injection 2-4 mg  2-4 mg Intravenous Q2H PRN Milford Cage, MD   4 mg at 05/11/12 1451  . ondansetron (ZOFRAN) injection 4 mg  4 mg Intravenous Q4H PRN Milford Cage, MD      . oxyCODONE (Oxy IR/ROXICODONE) immediate release tablet 5-10 mg  5-10 mg Oral Q4H PRN Milford Cage, MD   10 mg at 05/11/12 1025  . penicillin v potassium (VEETID) tablet 500 mg  500 mg Oral Q6H Elenora Gamma, MD      . polyethylene glycol (MIRALAX / GLYCOLAX) packet 17 g  17 g Oral Daily PRN Ardyth Gal, MD      . Tamsulosin HCl [facility]) capsule 0.4 mg  0.4 mg Oral QPC breakfast Milford Cage, MD   0.4 mg at 05/11/12 1022    LABS: Lab Results  Component Value Date   WBC 7.6 05/11/2012   HGB 11.7* 05/11/2012   HCT 33.7* 05/11/2012   MCV 86.9 05/11/2012   PLT 252 05/11/2012  Component Value Date/Time   NA 135 05/11/2012 0640   K 4.0 05/11/2012 0640   CL 101 05/11/2012 0640   CO2 24 05/11/2012 0640   GLUCOSE 123* 05/11/2012 0640   BUN 10 05/11/2012 0640   CREATININE 1.33 05/11/2012 0640   CALCIUM 8.8 05/11/2012 0640   GFRNONAA 65* 05/11/2012 0640   GFRAA 75* 05/11/2012 0640   Lab Results  Component Value Date   INR 1.13 05/10/2012   No results found for this basename: PTT    SOCIAL HISTORY: History   Social History  . Marital Status: Single    Spouse Name: N/A    Number of Children: N/A  . Years of Education: N/A   Occupational History  . Not on file.   Social History Main Topics  . Smoking status: Never Smoker   . Smokeless tobacco: Never Used   . Alcohol Use: Yes     Comment: occassional  . Drug Use: No  . Sexually Active:    Other Topics Concern  . Not on file   Social History Narrative  . No narrative on file    FAMILY HISTORY: History reviewed. No pertinent family history.   REVIEW OF SYSTEMS: Reviewed from chart for this admission.  DENTAL HISTORY: CHIEF COMPLAINT: Dental consultation requested  for evaluation of a cracked tooth.  HPI: Adam Wagner for 42 year old male referred for dental consultation.  Patient presented to the emergency department with a history of urinary retention and history of toothache symptoms involving the lower left molar. Patient subsequently admitted and placed on IV antibiotic therapy.   Dental consultation requested to evaluate lower left molar for treatment as indicated.  Patient provided a history of lower left molar pain for approximately one to 2 weeks. Patient had been in an intensity of 10 out of 10 but is currently 7/10 due to pain medication. Patient describes the pain as being sharp and last hours at a time. Pain with spontaneous as well.  Patient indicates that the pain is much better with current pain medication. Patient indicates he last saw a dentist approximately 2-3 years ago to have 2 teeth pulled by an Transport planner in [place]. Patient is unable to provide the name of the  oral surgeon. Patient does not seek regular dental care by his report.   DENTAL EXAMINATION:  GENERAL: Patient is a well-developed, well built male in no acute distress. HEAD AND NECK: Patient with left neck lymphadenopathy that is tender to palpation. No significant facial swelling is noted at this time. Patient has decreased maximum interincisal opening and trismus. INTRAORAL EXAM: Patient has decreased maximum interincisal opening measured at approximately 15-20 mm. No significant swelling associated with tooth #17.   DENTITION: Patient is missing tooth numbers 13,19, and 30. PERIODONTAL:  Patient has chronic periodontitis with plaque and calculus accumulations, selective areas gingival recession but no significant tooth mobility at this time. DENTAL CARIES/SUBOPTIMAL RESTORATIONS: Patient has extensive dental caries associated with tooth #17 . I would need a full series of dental radiographs to identify the other dental caries that may be present ENDODONTIC: Patient is complaining of acute pulpitis symptoms involving tooth #17. There may be some periapical pathology associate with tooth #17 . CROWN AND BRIDGE: There are no crown or bridge restorations noted. PROSTHODONTIC: The patient has no partial dentures by report OCCLUSION: The patient has a poor occlusal scheme but a stable occlusion at this time.  RADIOGRAPHIC INTERPRETATION: An orthopantogram was obtained and 05/09/2012. There are missing teeth. Dental caries associated  with tooth #17.  There may be some periapical pathology and radiolucency associated with the apices of tooth #17 . There is supra-eruption and drifting of the unopposed teeth into the edentulous areas.   ASSESSMENTS: 1. History of acute pulpitis symptoms from tooth #17 2 . Probable periapical pathology and radiolucency associated with tooth #7 3. Dental caries associated with tooth #17 4. 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 chronic periodontitis 6. Missing teeth 7. Malocclusion 8. Potentially complex extraction procedure due to extensive dental caries, severe trismus, and close proximity of the roots of tooth #17 to the inferior alveolar nerve canal requiring oral surgeon.    PLAN/RECOMMENDATIONS: 1. I discussed the risks, benefits, and complications of various treatment options with the patient in relationship to his medical and dental conditions. The patient will require the services of an oral surgeon to remove tooth #17 due to the complexity of the extraction procedure.  Patient will be maintained on IV antibiotic  therapy and medical team can assist in coordination of followup care with an oral surgeon once medically stable.  Patient also is aware of the need followup with the general dentist for evaluation for other treatment needs as indicated.   2. Discussion of findings with medical team as indicated.   Charlynne Pander, DDS

## 2012-05-11 NOTE — Progress Notes (Signed)
MD made aware of high BP. PRN BP medication ordered.Will continue to monitor

## 2012-05-11 NOTE — Progress Notes (Signed)
Attempt to call report nurse is currently busy will call me back

## 2012-05-11 NOTE — Progress Notes (Signed)
Seen and examined.  Discussed with Dr. Ermalinda Memos.  Agree with his management and documentation.  I am still shaking my head over this bizarre case.

## 2012-05-11 NOTE — Progress Notes (Signed)
GU  Patient noted to continue to have tachycardia and HTN. He feels that his abdomen is more distended this afternoon. Negative nausea or emesis. Positive bladder spasms. Negative fever.  I flushed his catheter in and out with ease. Equivalent volume in and out. Urine remains amber colored. Negative return of clot.  Filed Vitals:   05/11/12 1356  BP: 148/98  Pulse: 125  Temp: 98.4 F (36.9 C)  Resp: 20    Gen: AAM, NAD, Awake Abd: Soft, appropriately TTP, negative rebound TTP, increased distention. Positive bowel sounds. JP serosanguinous with low output. GU: Negative edema. Foley in place. Urine is amber without clot.  Urine output listed as 200cc since 07:00 this morning. There is an additional 420 cc in the collection bag right now, which would give him a urine output of >75cc/hour.  A/P: Intraperitoneal bladder perforation Exploratory laparotomy with cystorrhaphy.  -Continue foley catheter, which seems to be draining appropriately. -Continue IV fluids. -I do not feel that repeat imaging is indicated at this time. His urine is clearing and his has appropriate urine output. His bladder closure was water-tight when checked in the OR, but if his distention continues and continues to have tachycardia and HTN, may need to obtain cystogram to ensure no leakage.

## 2012-05-11 NOTE — Progress Notes (Signed)
Arrived to room 6n26 in bed, a/ox4, oriented to room and surroundings, parents at bedside, denies nausea, c/o abd oain 6/10, encouraged to use ICS.

## 2012-05-11 NOTE — Progress Notes (Signed)
Family Medicine Teaching Service Daily Progress Note Service Page: [phone]  Subjective:  Post op pain managed pretty well with current meds. Notes some tenderness/tenseness in his abdomen. No flatus yet.   Objective: Temp:  [98 F (36.7 C)-99.1 F (37.3 C)] 98.8 F (37.1 C) (02/05 0740) Pulse Rate:  [68-110] 82  (02/05 0740) Resp:  [14-22] 19  (02/05 0740) BP: (131-179)/(82-134) 164/104 mmHg (02/05 0740) SpO2:  [98 %-100 %] 99 % (02/05 0740) Exam: Gen: NAD, alert, cooperative with exam HEENT: NCAT, EOMI, left lower jaw posterior tooth with apparent crack in it CV: RRR, good S1/S2, no murmur Resp: CTABL, no wheezes, non-labored Abd: tender throughout to palpation, Clean dry intact bandage on lower abdomen. JP drain in place. + BS Ext: No edema, warm Neuro: Alert and oriented, No gross deficits   I have reviewed the patient's medications, labs, imaging, and diagnostic testing.  Notable results are summarized below.  CBC BMET   Lab 05/11/12 0640 05/10/12 1600 05/10/12 0540  WBC 7.6 9.0 7.5  HGB 11.7* 13.0 11.1*  HCT 33.7* 36.9* 31.2*  PLT 252 275 235    Lab 05/11/12 0640 05/10/12 1600 05/10/12 0540  NA 135 135 136  K 4.0 3.5 3.6  CL 101 98 101  CO2 24 26 26   BUN 10 9 12   CREATININE 1.33 1.28 1.35  GLUCOSE 123* 134* 113*  CALCIUM 8.8 9.7 9.3     Imaging/Diagnostic Tests: DG Orthopantogram 05/09/2012 IMPRESSION:  The cracked tooth may involve the posterior left mandibular molar  as the crown appears irregular.  Complete Abd [place] 05/09/2012 IMPRESSION:  Grossly unremarkable kidneys.  Nonvisualization of aorta and pancreas.  Small to moderate amount ascites.  Question of hepatic nodularity was raised by the performing  technologist though this is not definitely confirmed on the  obtained images; if there is clinical concern for cirrhosis  consider CT imaging with contrast.   Plan: Adam Wagner is a 42 y.o. year old male with no reported past medical history  presenting with with one day history of urinary retention and found to have grossly bloody urine on cath in ED, additionally with tooth pain and swelling.   AKI - Cr of 3.00 on admission trending down, 1.33 this am - Most likely due to an obstructive cause from and unknown cause - Urology consulting, appreciate reccs  - repeat UA shows positive nitrites, large LE, large Hgb, gram stain neg for bact, UCx pending - Continue Rocephin, narrow as cultures return - Avoid NSAIDs and other nephrotoxic agents as possible - D5 1/2 NS 150 mL/hr - FeNa 2.6 indicating intrinsic renal pathology, although this may be unreliable in gross hematuria  Gross hematuria: Bladder perforation - New onset after cath in the ED, due to bladder perforation now POD 1 from Open cystorrhaphy and exploratory laparotomy - urology consulting- appreciate their reccs - JP drain in place - + BS- sips and chips, advance to clears at lunch if BS continued and has flatus - renal US not indicative of obstructive cause  - Hgb down from 12.9 to 11.7 with two units PRBC peri-op  - Morphine 2-4 mgh IV q2 PRN for pain  Dental pain - cracked tooth, orthopantogram shows possible involvement of posterior L molar - continue ceftriaxone, once this is narrowed for urinary issues, could consider adding on amoxacillin  - IP dental consult for possible extraction- we appreciate their recommendations and assistance.  - oxycodone IR 5 mg Q6 hrs PRN for pain control  High blood pressure:  -  no history of HTN per patient but BP elevated to 134-179/82-115.  - norvasc 10 mg daily, PRN hydralazine with parameters.    FEN/GI: NPO until bowel function returns,  D5 1/2 NS @ 150 mL/hr  Prophylaxis: SCD's, no VTE pharmacologic prophylaxis given question of active urinary bleed  Disposition: step down, likely transfer to med surg this am, home when urologic issues resolved  Code Status: full   Kevin Fenton, MD 05/11/2012, 8:41 AM

## 2012-05-11 NOTE — Progress Notes (Signed)
Report called to [facility] on unit 6 [place]. Patient to be transported via wheelchair.

## 2012-05-12 ENCOUNTER — Inpatient Hospital Stay (HOSPITAL_COMMUNITY): Payer: MEDICAID

## 2012-05-12 ENCOUNTER — Encounter (HOSPITAL_COMMUNITY): Payer: Self-pay

## 2012-05-12 DIAGNOSIS — R000 Tachycardia, unspecified: Secondary | ICD-10-CM

## 2012-05-12 DIAGNOSIS — N390 Urinary tract infection, site not specified: Secondary | ICD-10-CM

## 2012-05-12 LAB — BASIC METABOLIC PANEL
BUN: 13 mg/dL (ref 6–23)
Chloride: 101 mEq/L (ref 96–112)
GFR calc Af Amer: 81 mL/min — ABNORMAL LOW (ref >90)
Glucose, Bld: 95 mg/dL (ref 70–99)
Potassium: 3.8 mEq/L (ref 3.5–5.1)
Sodium: 136 mEq/L (ref 135–145)

## 2012-05-12 LAB — CBC
HCT: 33.2 % — ABNORMAL LOW (ref 39.0–52.0)
Hemoglobin: 11.3 g/dL — ABNORMAL LOW (ref 13.0–17.0)
WBC: 4.3 10*3/uL (ref 4.0–10.5)

## 2012-05-12 MED ORDER — LIDOCAINE HCL 2 % EX GEL
Freq: Once | CUTANEOUS | Status: DC
  Filled 2012-05-12: qty 5

## 2012-05-12 MED ORDER — DOUBLE ANTIBIOTIC 500-10000 UNIT/GM EX OINT
TOPICAL_OINTMENT | Freq: Two times a day (BID) | CUTANEOUS | Status: DC
  Administered 2012-05-12 – 2012-05-19 (×13): via TOPICAL
  Administered 2012-05-19: 1 via TOPICAL
  Administered 2012-05-20: 11:00:00 via TOPICAL
  Filled 2012-05-12 (×2): qty 14.17

## 2012-05-12 MED ORDER — IOHEXOL 300 MG/ML  SOLN
100.0000 mL | Freq: Once | INTRAMUSCULAR | Status: AC | PRN
  Administered 2012-05-12: 100 mL via INTRAVENOUS

## 2012-05-12 MED ORDER — MENTHOL 3 MG MT LOZG: 1.0000 | LOZENGE | OROMUCOSAL | Status: DC | PRN

## 2012-05-12 MED ORDER — METOCLOPRAMIDE HCL 5 MG/ML IJ SOLN
10.0000 mg | Freq: Four times a day (QID) | INTRAMUSCULAR | Status: DC
  Administered 2012-05-12 – 2012-05-17 (×20): 10 mg via INTRAVENOUS
  Filled 2012-05-12 (×24): qty 2

## 2012-05-12 NOTE — Progress Notes (Signed)
Reviewed and discussed in rounds.  Agree with management by Dr. Felipa Emory assessment and plan.
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
## 2012-05-12 NOTE — Progress Notes (Signed)
Urology Progress Note  Subjective:     No acute urologic events overnight. Complains of nausea and abdominal distention. Urine is clearing, but remains amber. Pain well controlled. Patient with tachycardia through most of the day yesterday; he is at a regular rate this morning.   ROS: Positive: sore throat  Objective:  Patient Vitals for the past 24 hrs:  BP Temp Temp src Pulse Resp SpO2  05/11/12 2122 138/87 mmHg 98.6 F (37 C) Oral 107  18  94 %  05/11/12 1846 146/82 mmHg 98.5 F (36.9 C) Oral 119  20  90 %  05/11/12 1425 - - - - - 95 %  05/11/12 1356 148/98 mmHg 98.4 F (36.9 C) Oral 125  20  97 %  05/11/12 1204 175/111 mmHg 98.1 F (36.7 C) Oral 120  22  97 %  05/11/12 0740 164/104 mmHg 98.8 F (37.1 C) Oral 82  19  99 %    Physical Exam: General:  No acute distress, awake Cardiovascular:    [x]   S1/S2 present, RRR  []   Irregularly irregular Chest:  CTA-B, no crackles Abdomen:               []  Soft, appropriately TTP  []  Soft, NTTP  [x]  Soft, appropriately TTP, negative rebound TTP, abdominal distention stable from last night but remains moderate, incision(s) c/d/i, positive blisters that have broken and lost skin coverage, JP serosanguinous.  Genitourinary: Uncircumcised. Negative edema. Foley:  In position. Draining light pink-clear red without clots.    I/O last 3 completed shifts: In: 5750 [P.O.:100; I.V.:5400; IV Piggyback:250] Out: 2151 [Urine:2035; Emesis/NG output:1; Drains:90; Blood:25]  Urine output over the past 24 hours: 950cc  Recent Labs  Basename 05/12/12 0510 05/11/12 0640   HGB 11.3* 11.7*   WBC 4.3 7.6   PLT 267 252    Recent Labs  Basename 05/11/12 0640 05/10/12 1600   NA 135 135   K 4.0 3.5   CL 101 98   CO2 24 26   BUN 10 9   CREATININE 1.33 1.28   CALCIUM 8.8 9.7   GFRNONAA 65* 68*   GFRAA 75* 78*     Recent Labs  Basename 05/10/12 1600   INR 1.13   APTT 36     No components found with this basename: ABG:2    Length  of stay: 3 days.  Assessment: Intraperitoneal bladder perforation. POD#1 Open cystorrhaphy and exploratory laparotomy   Plan: -Renal function is good. He could be third spacing his fluids, but given his recent bladder injury, abdominal distention, and moderate urine output, I feel it is appropriate to obtain another cystogram to ensure there is no leak. I will order this this morning.   -NPO until after CT cystogram is complete.  -Bacitracin ointment and dressing to blistered areas.   Natalia Leatherwood, MD [phone]

## 2012-05-12 NOTE — Progress Notes (Signed)
GU  Patient doing well. I explained the CT cystogram findings, which showed no leak. Urine remains amber with adequate output.  Filed Vitals:   05/12/12 1438  BP: 154/104  Pulse: 112  Temp: 98.2 F (36.8 C)  Resp: 18   Gen: NAD Abd: moderately distended (stable, unchanged), negative rebound TTP, incision c/d/i, JP serosanguinous  A/P: Intraperitoneal bladder rupture s/p open surgical repair. Now with ileus  -I explained to the patient that his ileus may persist for a prolonged time due to the fact that there was urine in his peritoneal cavity.  -Discontinued JP drain today.  -Urine culture negative. Continue rocephin for a total of 5 days, then discontinue (unless it is being used to treat his tooth as well).  -I will see the patient again on Monday. My colleagues will round on the patient in my absence.

## 2012-05-12 NOTE — Progress Notes (Signed)
Having cystogram per Dr. Margarita Grizzle Ileus persists NGT if patient agreeable Reglan Patient examined and I agree with the assessment and plan  Violeta Gelinas, MD, MPH, FACS Pager: [phone]  05/12/2012 8:36 AM

## 2012-05-12 NOTE — Progress Notes (Signed)
Patient ID: Adam Wagner, male   DOB: February 06, 1971, 42 y.o.   MRN: [id number]   LOS: 3 days  POD#2  Subjective: C/o "cold" being in his throat, making him nauseated. No emesis. Feels bloating is about the same. Denies flatus.  Objective: Vital signs in last 24 hours: Temp:  [98.1 F (36.7 C)-98.8 F (37.1 C)] 98.1 F (36.7 C) (02/06 0601) Pulse Rate:  [82-125] 103  (02/06 0601) Resp:  [18-22] 18  (02/06 0601) BP: (138-175)/(82-111) 172/100 mmHg (02/06 0639) SpO2:  [90 %-99 %] 94 % (02/06 0601)    Lab Results:  CBC  Basename 05/12/12 0510 05/11/12 0640  WBC 4.3 7.6  HGB 11.3* 11.7*  HCT 33.2* 33.7*  PLT 267 252   BMET  Basename 05/12/12 0510 05/11/12 0640  NA 136 135  K 3.8 4.0  CL 101 101  CO2 25 24  GLUCOSE 95 123*  BUN 13 10  CREATININE 1.24 1.33  CALCIUM 8.7 8.8    General appearance: alert and no distress Resp: clear to auscultation bilaterally Cardio: regular rate and rhythm GI: Soft, moderate distension, absent BS. Incision C/D/I.   Assessment/Plan: Intraperitoneal bladder rupture Ileus -- Suggested NGT, pt to think about it. Will start prokinetic, continue sips and chips.   Freeman Caldron, PA-C Pager: [phone] General Trauma PA Pager: [phone]   05/12/2012

## 2012-05-13 ENCOUNTER — Inpatient Hospital Stay (HOSPITAL_COMMUNITY): Payer: MEDICAID

## 2012-05-13 DIAGNOSIS — K567 Ileus, unspecified: Secondary | ICD-10-CM | POA: Diagnosis not present

## 2012-05-13 DIAGNOSIS — R338 Other retention of urine: Secondary | ICD-10-CM | POA: Diagnosis present

## 2012-05-13 DIAGNOSIS — N3289 Other specified disorders of bladder: Secondary | ICD-10-CM

## 2012-05-13 DIAGNOSIS — R319 Hematuria, unspecified: Secondary | ICD-10-CM | POA: Diagnosis present

## 2012-05-13 LAB — TYPE AND SCREEN
ABO/RH(D): O POS
Antibody Screen: NEGATIVE
Unit division: 0
Unit division: 0

## 2012-05-13 LAB — BASIC METABOLIC PANEL
BUN: 16 mg/dL (ref 6–23)
CO2: 22 mEq/L (ref 19–32)
GFR calc non Af Amer: >90 mL/min (ref >90)
Glucose, Bld: 123 mg/dL — ABNORMAL HIGH (ref 70–99)
Potassium: 3.8 mEq/L (ref 3.5–5.1)

## 2012-05-13 LAB — CBC
HCT: 31.7 % — ABNORMAL LOW (ref 39.0–52.0)
Hemoglobin: 10.9 g/dL — ABNORMAL LOW (ref 13.0–17.0)
MCH: 30 pg (ref 26.0–34.0)
MCHC: 34.4 g/dL (ref 30.0–36.0)
RBC: 3.63 MIL/uL — ABNORMAL LOW (ref 4.22–5.81)

## 2012-05-13 MED ORDER — DEXTROSE 5 % IV SOLN
1.0000 g | INTRAVENOUS | Status: DC
  Administered 2012-05-13 – 2012-05-15 (×3): 1 g via INTRAVENOUS
  Filled 2012-05-13 (×4): qty 10

## 2012-05-13 MED ORDER — CALCIUM CARBONATE ANTACID 500 MG PO CHEW
2.0000 | CHEWABLE_TABLET | Freq: Once | ORAL | Status: AC
  Administered 2012-05-13: 400 mg via ORAL
  Filled 2012-05-13: qty 2

## 2012-05-13 MED ORDER — ATENOLOL 50 MG PO TABS
50.0000 mg | ORAL_TABLET | Freq: Every day | ORAL | Status: DC
  Administered 2012-05-13: 50 mg via ORAL
  Filled 2012-05-13 (×2): qty 1

## 2012-05-13 NOTE — Progress Notes (Signed)
Family Medicine Teaching Service Daily Progress Note Service Page: [phone]  Subjective:  Post op pain managed pretty well with current meds. Abdomen feels tense. Did get up to walk 2 x yesterday. Toleratiing water and ice well, no flatus.   Objective: Temp:  [97.8 F (36.6 C)-98.7 F (37.1 C)] 98.1 F (36.7 C) (02/07 0621) Pulse Rate:  [107-129] 122  (02/07 0621) Resp:  [18] 18  (02/07 0621) BP: (141-170)/(97-120) 141/108 mmHg (02/07 0621) SpO2:  [93 %-97 %] 93 % (02/07 4540) Exam: Gen: NAD, alert, cooperative with exam HEENT: NCAT, EOMI, left lower jaw posterior tooth with apparent crack in it CV: RRR, good S1/S2, no murmur Resp: CTABL, no wheezes, non-labored Abd: mildly tender throughout to palpation, Clean dry intact wound on lower abdomen, bandages Clean dry and intact X 2 oin RU and LU quads. + BS but hypoactive Ext: No edema, warm Neuro: Alert and oriented, No gross deficits   I have reviewed the patient's medications, labs, imaging, and diagnostic testing.  Notable results are summarized below.  CBC BMET   Lab 05/12/12 0510 05/11/12 0640 05/10/12 1600  WBC 4.3 7.6 9.0  HGB 11.3* 11.7* 13.0  HCT 33.2* 33.7* 36.9*  PLT 267 252 275    Lab 05/12/12 0510 05/11/12 0640 05/10/12 1600  NA 136 135 135  K 3.8 4.0 3.5  CL 101 101 98  CO2 25 24 26   BUN 13 10 9   CREATININE 1.24 1.33 1.28  GLUCOSE 95 123* 134*  CALCIUM 8.7 8.8 9.7     Imaging/Diagnostic Tests: DG Orthopantogram 05/09/2012 IMPRESSION:  The cracked tooth may involve the posterior left mandibular molar  as the crown appears irregular.  Complete Abd [place] 05/09/2012 IMPRESSION:  Grossly unremarkable kidneys.  Nonvisualization of aorta and pancreas.  Small to moderate amount ascites.  Question of hepatic nodularity was raised by the performing  technologist though this is not definitely confirmed on the  obtained images; if there is clinical concern for cirrhosis  consider CT imaging with contrast.    Plan: Devantae Babe is a 42 y.o. year old male with no reported past medical history presenting with with one day history of urinary retention and found to have grossly bloody urine on cath in ED, additionally with tooth pain and swelling.   Gross hematuria: Bladder perforation - New onset after cath in the ED, due to bladder perforation now POD 3 from Open cystorrhaphy and exploratory laparotomy - urology consulting- appreciate their reccs  - Repeat cystogram without evidence of bladder leakage  - Post op ileus may remain for a few days considering he had urine in his abdomen  - Restarted rocephin-will continue 2 mored days, then back to [facility] for tooth - General surgery consulting- appreciate reccs  - Agree with reglan and NG tube, but patient agrees to defer until he is nauseated and/or vomiting - JP drain d/c'd this am.  - renal US not indicative of obstructive cause- started flomax  - Hgb down from 12.9 to 11.3 with two units PRBC peri-op , pending this am.  - Morphine 2-4 mgh IV q2 PRN for pain, 5-10 oxycodone Q4 prn  Post Op ileus - Hypoactive BS, continue sips and chips for now - He's agreeable for NG tube id he developes emesis - Started reglan - Gen surg following- appreciate reccs  AKI - Cr of 3.00 on admission trending down, 1.24 on 2/6 - Most likely due to an obstructive cause from and unknown cause - Urology consulting, appreciate  reccs  - repeat UA shows positive nitrites, large LE, large Hgb, gram stain neg for bact, UCx pending - Rocephin Times 3, now on [facility] VK for dental concerns - on Flomax - Avoid NSAIDs and other nephrotoxic agents as possible - NS 150 mL/hr  Dental pain- acute pulpitis and dental carie of tooth #17 - IP dentist consulting- appreciate reccs  - Will need oral surgeon to remove tooth- OMFS not IP so will refer OP.  - cracked tooth, orthopantogram shows possible involvement of posterior L molar - now rocephin for 2 more days, will add back  p[facility] v k after that - pain control per above  High blood pressure: with tachycardia - no history of HTN per patient but BP elevated to 138-175/100-111.  - Tachycardia 107-129 - norvasc 10 mg daily, PRN hydralazine with parameters.  - atenolol ordered/added  FEN/GI: sips an dchips until bowel function returns,  NS @ 150 mL/hr  Prophylaxis: SCD's, no VTE pharmacologic prophylaxis given question of active urinary bleed  Disposition: med surg, home when urologic issues resolved  Code Status: full   Kevin Fenton, MD 05/13/2012, 7:18 AM

## 2012-05-13 NOTE — Progress Notes (Addendum)
Still has some ileus but +BS today.  CXR with BB ATX - I encouraged pulmonary toilet. WBC WNL yesterday, recheck labs. Patient examined and I agree with the assessment and plan  Violeta Gelinas, MD, MPH, FACS Pager: [phone]  05/13/2012 9:39 AM

## 2012-05-13 NOTE — Progress Notes (Signed)
Patient ID: Adam Wagner, male   DOB: Nov 02, 1970, 42 y.o.   MRN: [id number]   LOS: 4 days  POD#3  Subjective: NSC since yesterday. Only new c/o is some "spit" in his throat, maybe a little regurg with hiccups. Denies CP, SOB, flatus. Pain about the same.  Objective: Vital signs in last 24 hours: Temp:  [97.8 F (36.6 C)-98.7 F (37.1 C)] 98.1 F (36.7 C) (02/07 0621) Pulse Rate:  [107-129] 122  (02/07 0621) Resp:  [18] 18  (02/07 0621) BP: (141-170)/(97-120) 141/108 mmHg (02/07 0621) SpO2:  [93 %-97 %] 93 % (02/07 0621)    General appearance: alert and no distress Resp: clear to auscultation bilaterally Cardio: Tachycardia GI: Soft, +BS. Moderate distension. Incision C/D/I. Appropriate TTP. Extremities: no edema, redness or tenderness in the calves or thighs Pulses: 2+ and symmetric   Assessment/Plan: Intraperitoneal bladder rupture Ileus -- Continue prokinetic, continue sips and chips. Bowel sounds increased so may be starting to resolve. Tachycardia, HTN, hypoxia -- HR had increased yesterday into the 100's, now in 120's. Continues with HTN. Will add beta blocker to regimen. I see no e/o DVT and though O2 sats are lower than I would expect at times they don't seem appreciably different. No fever to indicate infection but will get CBC. Check CXR.    Freeman Caldron, PA-C Pager: [phone] General Trauma PA Pager: [phone]   05/13/2012

## 2012-05-13 NOTE — Progress Notes (Signed)
Seen, examined and discussed in rounds.  Agree with Dr. Bradshaw's documentation and management. 

## 2012-05-13 NOTE — Progress Notes (Signed)
Post op bladder repair HR and BP elevated Ileus as noted Good urine output Last labs good Foley draining Will observe  And follow Cystogram yesterday normal

## 2012-05-14 LAB — BASIC METABOLIC PANEL
BUN: 17 mg/dL (ref 6–23)
CO2: 25 mEq/L (ref 19–32)
Calcium: 8.8 mg/dL (ref 8.4–10.5)
Creatinine, Ser: 0.91 mg/dL (ref 0.50–1.35)
GFR calc non Af Amer: >90 mL/min (ref >90)
Glucose, Bld: 93 mg/dL (ref 70–99)
Sodium: 134 mEq/L — ABNORMAL LOW (ref 135–145)

## 2012-05-14 LAB — CBC
Hemoglobin: 10.5 g/dL — ABNORMAL LOW (ref 13.0–17.0)
MCH: 29.9 pg (ref 26.0–34.0)
MCHC: 34.1 g/dL (ref 30.0–36.0)
MCV: 87.7 fL (ref 78.0–100.0)
RBC: 3.51 MIL/uL — ABNORMAL LOW (ref 4.22–5.81)

## 2012-05-14 MED ORDER — METOPROLOL TARTRATE 25 MG PO TABS
25.0000 mg | ORAL_TABLET | Freq: Two times a day (BID) | ORAL | Status: DC
  Administered 2012-05-14 – 2012-05-15 (×3): 25 mg via ORAL
  Filled 2012-05-14 (×4): qty 1

## 2012-05-14 MED ORDER — PANTOPRAZOLE SODIUM 40 MG PO TBEC
40.0000 mg | DELAYED_RELEASE_TABLET | Freq: Every day | ORAL | Status: DC
  Administered 2012-05-14 – 2012-05-19 (×6): 40 mg via ORAL
  Filled 2012-05-14 (×6): qty 1

## 2012-05-14 NOTE — Progress Notes (Signed)
4 Days Post-Op  Subjective: Pt con't with abd distention.  No flatus or BM.    Objective: Vital signs in last 24 hours: Temp:  [97.9 F (36.6 C)-98.4 F (36.9 C)] 98.4 F (36.9 C) (02/08 0546) Pulse Rate:  [98-114] 98 (02/08 0546) Resp:  [18-20] 18 (02/08 0546) BP: (134-154)/(82-102) 154/95 mmHg (02/08 0546) SpO2:  [93 %-96 %] 95 % (02/08 0546)    Intake/Output from previous day: 02/07 0701 - 02/08 0700 In: 2407.5 [I.V.:2407.5] Out: 1175 [Urine:1175] Intake/Output this shift:    General appearance: alert and cooperative GI: distended, bowel sounds, no rebound / guarding  Lab Results:   Recent Labs  05/13/12 0915 05/14/12 0540  WBC 3.0* 2.2*  HGB 10.9* 10.5*  HCT 31.7* 30.8*  PLT 300 301   BMET  Recent Labs  05/13/12 0915 05/14/12 0540  NA 135 134*  K 3.8 3.5  CL 102 101  CO2 22 25  GLUCOSE 123* 93  BUN 16 17  CREATININE 0.95 0.91  CALCIUM 8.7 8.8   PT/INR No results found for this basename: LABPROT, INR,  in the last 72 hours ABG No results found for this basename: PHART, PCO2, PO2, HCO3,  in the last 72 hours  Studies/Results: Dg Chest 2 View  05/13/2012  *RADIOLOGY REPORT*  Clinical Data: Hypoxia.  History of exploratory laparotomy 3 days ago  CHEST - 2 VIEW  Comparison: None.  Findings: Low lung volumes are present.  The patient is in a semi- erect position and this combined with low lung volumes accentuates the cardiac contour but this is felt likely to be within normal limits.  Ectasia of the thoracic aorta is suggested but may also be accentuated by positioning.  The lung fields appear clear with the exception of crowding of bronchovascular markings at both lung bases due to low lung volumes.  No pleural fluid or focal infiltrates are seen.  No pulmonary vascular congestion or pulmonary edema is noted.  The visualized portion of the bowel gas pattern reveals several mildly dilated bowel loops in the upper abdomen and air fluid levels on the lateral  view. This can be more fully evaluated with an abdominal series if felt clinically indicated.  IMPRESSION: Low lung volumes with crowding of bronchovascular markings at the lung bases and no worrisome focal or acute abnormality suggested.  Mildly dilated small bowel loops with air fluid levels suggested in the visualized portion of the abdomen. Full evaluation if indicated clinically can be obtained with flat plate and upright views of the abdomen.   Original Report Authenticated By: Rhodia Albright, M.D.    Ct Pelvis W Contrast  05/12/2012  *RADIOLOGY REPORT*  Clinical Data:  Status post bladder rupture with recent repair.  CT PELVIS WITH CONTRAST  Technique:  Multidetector CT imaging of the pelvis was performed using the standard protocol following the bolus administration of intravenous contrast.  Contrast: OMNIPAQUE IOHEXOL 300 MG/ML  SOLN  Comparison:  CT scan 05/10/2012 and cystogram 05/10/2012  Findings:  Contrast is distended the bladder.  The bladder wall is mildly irregular but no active extravasation of contrast is demonstrated to suggest a persistent leak.  The distal ureters appear normal.  There is a small amount of persistent high-density free pelvic fluid.  Persistent dilated small bowel and colon.  There is a small amount of residual extra peritoneal air likely related to recent surgery.  IMPRESSION: No CT findings for persistent bladder leak.   Original Report Authenticated By: Rudie Meyer, M.D.  Anti-infectives: Anti-infectives   Start     Dose/Rate Route Frequency Ordered Stop   05/13/12 0800  cefTRIAXone (ROCEPHIN) 1 g in dextrose 5 % 50 mL IVPB     1 g 100 mL/hr over 30 Minutes Intravenous Every 24 hours 05/13/12 0719     05/11/12 1700  penicillin v potassium (VEETID) tablet 500 mg  Status:  Discontinued     500 mg Oral 4 times per day 05/11/12 1140 05/13/12 0731   05/10/12 2200  ciprofloxacin (CIPRO) IVPB 400 mg  Status:  Discontinued     400 mg 200 mL/hr over 60  Minutes Intravenous Every 12 hours 05/10/12 1813 05/10/12 2237   05/10/12 1300  cefTRIAXone (ROCEPHIN) 1 g in dextrose 5 % 50 mL IVPB     1 g 100 mL/hr over 30 Minutes Intravenous Every 24 hours 05/09/12 1936 05/11/12 1406   05/09/12 1300  cefTRIAXone (ROCEPHIN) 1 g in dextrose 5 % 50 mL IVPB     1 g 100 mL/hr over 30 Minutes Intravenous  Once 05/09/12 1249 05/09/12 1349      Assessment/Plan: s/p Procedure(s) with comments: exploratory laparotomy for bladder perforation, cystoscopy (N/A) - exploratory laparotomy for bladder perforation, cystoscopy Pt con't with ileus Con't CLD at this time and wait for bowel function to return, con't Reglan Mobilize   LOS: 5 days    Marigene Ehlers., [facility] 05/14/2012

## 2012-05-14 NOTE — Progress Notes (Signed)
Family Medicine Teaching Service Daily Progress Note Service Page: [phone]  Subjective:  Post op pain managed pretty well with current meds. Abdomen feels tense, but feels like he is going to pass some gas.  Had hearburn overnight, tums helped.   Objective: Temp:  [97.9 F (36.6 C)-98.4 F (36.9 C)] 98.4 F (36.9 C) (02/08 0546) Pulse Rate:  [98-114] 98 (02/08 0546) Resp:  [18-20] 18 (02/08 0546) BP: (134-154)/(82-102) 154/95 mmHg (02/08 0546) SpO2:  [93 %-96 %] 95 % (02/08 0546) Exam: Gen: NAD, alert, cooperative with exam HEENT: NCAT, EOMI, left lower jaw posterior tooth with apparent crack in it CV: RRR, good S1/S2, no murmur Resp: CTABL, no wheezes, non-labored Abd: mildly tender and distended, Clean dry intact wound on lower abdomen, bandages Clean dry and intact X 2 oin RU and LU quads. + BS but hypoactive Ext: No edema, warm Neuro: Alert and oriented, No gross deficits   I have reviewed the patient's medications, labs, imaging, and diagnostic testing.  Notable results are summarized below.  CBC BMET   Recent Labs Lab 05/12/12 0510 05/13/12 0915 05/14/12 0540  WBC 4.3 3.0* 2.2*  HGB 11.3* 10.9* 10.5*  HCT 33.2* 31.7* 30.8*  PLT 267 300 301    Recent Labs Lab 05/12/12 0510 05/13/12 0915 05/14/12 0540  NA 136 135 134*  K 3.8 3.8 3.5  CL 101 102 101  CO2 25 22 25   BUN 13 16 17   CREATININE 1.24 0.95 0.91  GLUCOSE 95 123* 93  CALCIUM 8.7 8.7 8.8     Imaging/Diagnostic Tests: DG Orthopantogram 05/09/2012 IMPRESSION:  The cracked tooth may involve the posterior left mandibular molar  as the crown appears irregular.  Complete Abd [place] 05/09/2012 IMPRESSION:  Grossly unremarkable kidneys.  Nonvisualization of aorta and pancreas.  Small to moderate amount ascites.  Question of hepatic nodularity was raised by the performing  technologist though this is not definitely confirmed on the  obtained images; if there is clinical concern for cirrhosis  consider CT  imaging with contrast.   Plan: Adam Wagner is a 42 y.o. year old male with no reported past medical history presenting with with one day history of urinary retention and found to have grossly bloody urine on cath in ED, additionally with tooth pain and swelling.   Gross hematuria: Bladder perforation - New onset after cath in the ED, due to bladder perforation now POD 3 from Open cystorrhaphy and exploratory laparotomy - urology consulting- appreciate their reccs  - Repeat cystogram without evidence of bladder leakage  - Post op ileus continues, discussed ambulation with patient  - Restarted rocephin-will continue 2 mored days, then back to penn v k for tooth - General surgery consulting- appreciate reccs  - Agree with reglan and NG tube, but patient agrees to defer until he is nauseated and/or vomiting - Hgb stable 10.5 this am - Morphine 2-4 mgh IV q2 PRN for pain, 5-10 oxycodone Q4 prn  Post Op ileus - Hypoactive BS, continue sips and chips for now - Hold off on NGT for now (no vomiting) - Started reglan - Gen surg following- appreciate reccs - Ambulate  AKI - Cr of 3.00 on admission, now normalzing - Urology consulting, appreciate reccs  - repeat UA shows positive nitrites, large LE, large Hgb, gram stain neg for bact, UCx pending - Rocephin  - on Flomax - Avoid NSAIDs and other nephrotoxic agents as possible - NS 150 mL/hr  Dental pain- acute pulpitis and dental carie of tooth #17 -  IP dentist consulting- appreciate reccs  - Will need oral surgeon to remove tooth- OMFS not IP so will refer OP.  - cracked tooth, orthopantogram shows possible involvement of posterior L molar - now rocephin for 2 more days, will add back penn v k after that - pain control per above  High blood pressure: with tachycardia - no history of HTN per patient but BP elevated to 138-175/100-111.  - Tachycardia 107-129 - norvasc 10 mg daily, PRN hydralazine with parameters.  - will change  atenolol to metoprolol for better pulse and BP control  FEN/GI: sips an dchips until bowel function returns,  NS @ 150 mL/hr  Prophylaxis: SCD's, no VTE pharmacologic prophylaxis given question of active urinary bleed  Disposition: pending improvement of post-op ileus, tolerating PO, pain control  Code Status: full   Adam Fatzinger, MD 05/14/2012, 7:02 AM

## 2012-05-14 NOTE — Progress Notes (Signed)
4 Days Post-Op Subjective: Patient reports passing flatus and pain control good. He said he has not had a bowel movement but does report possibly passing a small amount of gas. He is not having any nausea or vomiting. His abdomen remains distended. He has been sitting on a small amount of water and sucking on ice chips to keep his mouth from getting to dry. He also reports some mild dyspepsia.  Objective: Vital signs in last 24 hours: Temp:  [97.9 F (36.6 C)-98.4 F (36.9 C)] 98.4 F (36.9 C) (02/08 0546) Pulse Rate:  [98-114] 98 (02/08 0546) Resp:  [18-20] 18 (02/08 0546) BP: (134-154)/(82-102) 154/95 mmHg (02/08 0546) SpO2:  [93 %-96 %] 95 % (02/08 0546)  Intake/Output from previous day: 02/07 0701 - 02/08 0700 In: 2407.5 [I.V.:2407.5] Out: 1175 [Urine:1175] Intake/Output this shift:    Physical Exam:  General:alert and cooperative GI: not done and distended Incision looks good with no sign of infection.  Lab Results:  Recent Labs  05/12/12 0510 05/13/12 0915 05/14/12 0540  HGB 11.3* 10.9* 10.5*  HCT 33.2* 31.7* 30.8*   BMET  Recent Labs  05/13/12 0915 05/14/12 0540  NA 135 134*  K 3.8 3.5  CL 102 101  CO2 22 25  GLUCOSE 123* 93  BUN 16 17  CREATININE 0.95 0.91  CALCIUM 8.7 8.8   No results found for this basename: LABPT, INR,  in the last 72 hours No results found for this basename: LABURIN,  in the last 72 hours Results for orders placed during the hospital encounter of 05/09/12  URINE CULTURE     Status: None   Collection Time    05/09/12 11:29 AM      Result Value Range Status   Specimen Description URINE, CATHETERIZED   Final   Special Requests none Normal   Final   Culture  Setup Time 05/09/2012 12:18   Final   Colony Count NO GROWTH   Final   Culture NO GROWTH   Final   Report Status 05/10/2012 FINAL   Final  GRAM STAIN     Status: None   Collection Time    05/09/12  5:26 PM      Result Value Range Status   Specimen Description URINE,  RANDOM   Final   Special Requests Normal   Final   Gram Stain     Final   Value: WBC PRESENT,BOTH PMN AND MONONUCLEAR     NEGATIVE FOR BACTERIA     CYTOSPIN SLIDE   Report Status 05/09/2012 FINAL   Final  SURGICAL PCR SCREEN     Status: None   Collection Time    05/10/12  4:54 PM      Result Value Range Status   MRSA, PCR NEGATIVE  NEGATIVE Final   Staphylococcus aureus NEGATIVE  NEGATIVE Final   Comment:            The Xpert SA Assay (FDA     approved for NASAL specimens     in patients over 26 years of age),     is one component of     a comprehensive surveillance     program.  Test performance has     been validated by [employer] for patients greater     than or equal to 56 year old.     It is not intended     to diagnose infection nor to     guide or monitor treatment.  Studies/Results: Dg Chest 2 View  05/13/2012  *RADIOLOGY REPORT*  Clinical Data: Hypoxia.  History of exploratory laparotomy 3 days ago  CHEST - 2 VIEW  Comparison: None.  Findings: Low lung volumes are present.  The patient is in a semi- erect position and this combined with low lung volumes accentuates the cardiac contour but this is felt likely to be within normal limits.  Ectasia of the thoracic aorta is suggested but may also be accentuated by positioning.  The lung fields appear clear with the exception of crowding of bronchovascular markings at both lung bases due to low lung volumes.  No pleural fluid or focal infiltrates are seen.  No pulmonary vascular congestion or pulmonary edema is noted.  The visualized portion of the bowel gas pattern reveals several mildly dilated bowel loops in the upper abdomen and air fluid levels on the lateral view. This can be more fully evaluated with an abdominal series if felt clinically indicated.  IMPRESSION: Low lung volumes with crowding of bronchovascular markings at the lung bases and no worrisome focal or acute abnormality suggested.  Mildly dilated small bowel  loops with air fluid levels suggested in the visualized portion of the abdomen. Full evaluation if indicated clinically can be obtained with flat plate and upright views of the abdomen.   Original Report Authenticated By: Rhodia Albright, M.D.    Ct Pelvis W Contrast  05/12/2012  *RADIOLOGY REPORT*  Clinical Data:  Status post bladder rupture with recent repair.  CT PELVIS WITH CONTRAST  Technique:  Multidetector CT imaging of the pelvis was performed using the standard protocol following the bolus administration of intravenous contrast.  Contrast: OMNIPAQUE IOHEXOL 300 MG/ML  SOLN  Comparison:  CT scan 05/10/2012 and cystogram 05/10/2012  Findings:  Contrast is distended the bladder.  The bladder wall is mildly irregular but no active extravasation of contrast is demonstrated to suggest a persistent leak.  The distal ureters appear normal.  There is a small amount of persistent high-density free pelvic fluid.  Persistent dilated small bowel and colon.  There is a small amount of residual extra peritoneal air likely related to recent surgery.  IMPRESSION: No CT findings for persistent bladder leak.   Original Report Authenticated By: Rudie Meyer, M.D.     Assessment/Plan: He continues to have an ileus. He is not experiencing nausea or vomiting. He has been up and walking. He may benefit from a Dulcolax suppository but I would not recommend an NG tube yet. If he becomes nauseated begins vomiting then an NG tube will be necessary. For right now I would recommend managing his ileus conservatively. He is having some dyspepsia and would likely benefit from a proton pump inhibitor.  Continue conservative management of his ileus.  Continue Foley catheter drainage.  Begin a proton pump inhibitor   LOS: 5 days   Jennavieve Arrick C 05/14/2012, 7:50 AM

## 2012-05-14 NOTE — Progress Notes (Signed)
Left groin drain site continues to leak saturing 3, 4x4 and abd pad. Have changed 4times since 11 am today. Leaks when patient stands. Also foley leaks some at insertion site. Penis and left groin remain edematous. Have elevated and used ice intermittently today.

## 2012-05-14 NOTE — Progress Notes (Signed)
Seen and examined.  Still hypoactive bowel sounds and mild abd distension.  Discussed with Dr. Lula Olszewski.  Will encourage ambulation and incentive spirometry.

## 2012-05-15 DIAGNOSIS — R339 Retention of urine, unspecified: Secondary | ICD-10-CM

## 2012-05-15 LAB — BASIC METABOLIC PANEL
CO2: 23 mEq/L (ref 19–32)
Calcium: 8.7 mg/dL (ref 8.4–10.5)
Creatinine, Ser: 0.89 mg/dL (ref 0.50–1.35)
GFR calc Af Amer: >90 mL/min (ref >90)
GFR calc non Af Amer: >90 mL/min (ref >90)
Sodium: 135 mEq/L (ref 135–145)

## 2012-05-15 LAB — CBC
MCH: 29.9 pg (ref 26.0–34.0)
MCHC: 34.4 g/dL (ref 30.0–36.0)
MCV: 86.7 fL (ref 78.0–100.0)
Platelets: 282 10*3/uL (ref 150–400)
RBC: 3.45 MIL/uL — ABNORMAL LOW (ref 4.22–5.81)
RDW: 11.5 % (ref 11.5–15.5)

## 2012-05-15 MED ORDER — METOPROLOL TARTRATE 50 MG PO TABS
50.0000 mg | ORAL_TABLET | Freq: Two times a day (BID) | ORAL | Status: DC
  Administered 2012-05-15 – 2012-05-20 (×10): 50 mg via ORAL
  Filled 2012-05-15 (×11): qty 1

## 2012-05-15 MED ORDER — MORPHINE SULFATE 2 MG/ML IJ SOLN
2.0000 mg | INTRAMUSCULAR | Status: DC | PRN
  Administered 2012-05-15 – 2012-05-16 (×2): 2 mg via INTRAVENOUS
  Filled 2012-05-15 (×2): qty 1

## 2012-05-15 MED ORDER — POTASSIUM CHLORIDE 10 MEQ/100ML IV SOLN
10.0000 meq | INTRAVENOUS | Status: AC
  Administered 2012-05-15 (×4): 10 meq via INTRAVENOUS
  Filled 2012-05-15: qty 400

## 2012-05-15 NOTE — Progress Notes (Signed)
Seen and examined.  Still with abd distension.  Passing flatus.  Agree with Dr. Ermalinda Memos.

## 2012-05-15 NOTE — Progress Notes (Signed)
IN TO DO ROUTINE PIV RESTART, CURRENT PIV IS LOCATED IN LEFT ARM/WRIST AREA, WNL'S, SITE LOOKS WONDERFUL, AT THIS TIME PT HAS 2 MORE Kt RUNS TO INFUSE, CURRENT PIV IS 18G AND WAS LEFT IN PLACE TO FINISH OUT Kt RUNS, STAFF RN BRENDA IN AND AGREES TO LEAVE PIV AT THIS POINT

## 2012-05-15 NOTE — Plan of Care (Signed)
Problem: Phase I Progression Outcomes Goal: Voiding-avoid urinary catheter unless indicated Outcome: Not Applicable Date Met:  05/15/12 Patient to go home with foley

## 2012-05-15 NOTE — Progress Notes (Signed)
Patient ID: Adam Wagner, male   DOB: Aug 19, 1970, 42 y.o.   MRN: [id number] 5 Days Post-Op Subjective: Patient is without new complaint. He reports having begun to pass flatus. He has not had a bowel movement yet. He does not have nausea or vomiting. He denies dyspepsia.  Objective: Vital signs in last 24 hours: Temp:  [97.3 F (36.3 C)-97.6 F (36.4 C)] 97.6 F (36.4 C) (02/08 2121) Pulse Rate:  [99-100] 100 (02/08 2121) Resp:  [20] 20 (02/08 2121) BP: (144-154)/(93-109) 144/93 mmHg (02/08 2121) SpO2:  [94 %] 94 % (02/08 2121)  Intake/Output from previous day: 02/08 0701 - 02/09 0700 In: 2887.5 [I.V.:2787.5; IV Piggyback:100] Out: 1150 [Urine:1150] Intake/Output this shift:    Past Medical History  Diagnosis Date  . Hematuria 05/09/2012   Current Facility-Administered Medications  Medication Dose Route Frequency Provider Last Rate Last Dose  . 0.9 %  sodium chloride infusion   Intravenous Continuous Andrena Mews, DO 150 mL/hr at 05/15/12 4098    . amLODipine (NORVASC) tablet 10 mg  10 mg Oral Daily Elenora Gamma, MD   10 mg at 05/14/12 1119  . cefTRIAXone (ROCEPHIN) 1 g in dextrose 5 % 50 mL IVPB  1 g Intravenous Q24H Elenora Gamma, MD   1 g at 05/14/12 0826  . heparin injection 5,000 Units  5,000 Units Subcutaneous Q8H Milford Cage, MD   5,000 Units at 05/15/12 [phone]  . hydrALAZINE (APRESOLINE) injection 5 mg  5 mg Intravenous Q4H PRN Elenora Gamma, MD   5 mg at 05/13/12 [phone]  . lidocaine (XYLOCAINE) 2 % jelly   Topical Once Freeman Caldron, PA      . menthol-cetylpyridinium (CEPACOL) lozenge 3 mg  1 lozenge Oral PRN Milford Cage, MD      . metoCLOPramide [facility]) injection 10 mg  10 mg Intravenous Q6H Freeman Caldron, PA   10 mg at 05/15/12 0610  . metoprolol tartrate (LOPRESSOR) tablet 25 mg  25 mg Oral BID Ardyth Gal, MD   25 mg at 05/14/12 2245  . morphine 2 MG/ML injection 2-4 mg  2-4 mg Intravenous Q2H PRN Milford Cage, MD   2 mg at 05/15/12 0610  . ondansetron (ZOFRAN) injection 4 mg  4 mg Intravenous Q4H PRN Milford Cage, MD   4 mg at 05/13/12 0432  . oxyCODONE (Oxy IR/ROXICODONE) immediate release tablet 5-10 mg  5-10 mg Oral Q4H PRN Milford Cage, MD   10 mg at 05/12/12 0615  . pantoprazole (PROTONIX) EC tablet 40 mg  40 mg Oral Daily Garnett Farm, MD   40 mg at 05/14/12 1119  . polyethylene glycol (MIRALAX / GLYCOLAX) packet 17 g  17 g Oral Daily PRN Ardyth Gal, MD      . polymixin-bacitracin (POLYSPORIN) ointment   Topical BID Milford Cage, MD      . potassium chloride 10 mEq in 100 mL IVPB  10 mEq Intravenous Q1 Hr x 4 Axel Filler, MD      . Tamsulosin HCl [facility]) capsule 0.4 mg  0.4 mg Oral QPC breakfast Milford Cage, MD   0.4 mg at 05/14/12 1120    Physical Exam:  General: Patient is in no apparent distress Lungs: Normal respiratory effort, chest expands symmetrically. GI: The abdomen is soft with occasional bowel sound noted. It remains distended.    Lab Results:  Recent Labs  05/13/12 0915 05/14/12 0540 05/15/12 0540  WBC 3.0* 2.2* 2.8*  HGB  10.9* 10.5* 10.3*  HCT 31.7* 30.8* 29.9*   BMET  Recent Labs  05/14/12 0540 05/15/12 0540  NA 134* 135  K 3.5 3.4*  CL 101 100  CO2 25 23  GLUCOSE 93 75  BUN 17 15  CREATININE 0.91 0.89  CALCIUM 8.8 8.7   No results found for this basename: LABPT, INR,  in the last 72 hours No results found for this basename: LABURIN,  in the last 72 hours Results for orders placed during the hospital encounter of 05/09/12  URINE CULTURE     Status: None   Collection Time    05/09/12 11:29 AM      Result Value Range Status   Specimen Description URINE, CATHETERIZED   Final   Special Requests none Normal   Final   Culture  Setup Time 05/09/2012 12:18   Final   Colony Count NO GROWTH   Final   Culture NO GROWTH   Final   Report Status 05/10/2012 FINAL   Final  GRAM STAIN     Status: None    Collection Time    05/09/12  5:26 PM      Result Value Range Status   Specimen Description URINE, RANDOM   Final   Special Requests Normal   Final   Gram Stain     Final   Value: WBC PRESENT,BOTH PMN AND MONONUCLEAR     NEGATIVE FOR BACTERIA     CYTOSPIN SLIDE   Report Status 05/09/2012 FINAL   Final  SURGICAL PCR SCREEN     Status: None   Collection Time    05/10/12  4:54 PM      Result Value Range Status   MRSA, PCR NEGATIVE  NEGATIVE Final   Staphylococcus aureus NEGATIVE  NEGATIVE Final   Comment:            The Xpert SA Assay (FDA     approved for NASAL specimens     in patients over 30 years of age),     is one component of     a comprehensive surveillance     program.  Test performance has     been validated by [employer] for patients greater     than or equal to 49 year old.     It is not intended     to diagnose infection nor to     guide or monitor treatment.    Studies/Results: @[id number] @  Assessment/Plan: His abdomen remains slightly distended although it appears that his ileus is beginning to resolve spontaneously. He is beginning to pass flatus.  No new urologic recommendations today.  Adam Wagner C 05/15/2012, 8:30 AM

## 2012-05-15 NOTE — Progress Notes (Signed)
Family Medicine Teaching Service Daily Progress Note Service Page: [phone]  Subjective:  Post op pain managed pretty well with current meds. siome continued hiccups, some flatus starting yesterday, no nausea or vomiting  Per nursing has soaked 4 dresssings during day yesterday and 2 overnight, state that it runs down his leg when he walks.   Objective: Temp:  [97.3 F (36.3 C)-97.6 F (36.4 C)] 97.6 F (36.4 C) (02/08 2121) Pulse Rate:  [99-100] 100 (02/08 2121) Resp:  [20] 20 (02/08 2121) BP: (144-154)/(93-109) 144/93 mmHg (02/08 2121) SpO2:  [94 %] 94 % (02/08 2121) Exam: Gen: NAD, alert, cooperative with exam HEENT: NCAT, EOMI, left lower jaw posterior tooth with apparent crack in it CV: RRR, good S1/S2, no murmur Resp: CTABL, no wheezes, non-labored Abd: mildly tender and distended, Clean dry intact wound on lower abdomen, bandages Clean dry and intact X 2 oin RU and LU quads. + BS but hypoactive, L groin drain sight without erythema or induration expresses moderate serosanginous fluid with pressure.  Ext: No edema, warm Neuro: Alert and oriented, No gross deficits   I have reviewed the patient's medications, labs, imaging, and diagnostic testing.  Notable results are summarized below.  CBC BMET   Recent Labs Lab 05/13/12 0915 05/14/12 0540 05/15/12 0540  WBC 3.0* 2.2* 2.8*  HGB 10.9* 10.5* 10.3*  HCT 31.7* 30.8* 29.9*  PLT 300 301 282    Recent Labs Lab 05/13/12 0915 05/14/12 0540 05/15/12 0540  NA 135 134* 135  K 3.8 3.5 3.4*  CL 102 101 100  CO2 22 25 23   BUN 16 17 15   CREATININE 0.95 0.91 0.89  GLUCOSE 123* 93 75  CALCIUM 8.7 8.8 8.7     Imaging/Diagnostic Tests: DG Orthopantogram 05/09/2012 IMPRESSION:  The cracked tooth may involve the posterior left mandibular molar  as the crown appears irregular.  Complete Abd [place] 05/09/2012 IMPRESSION:  Grossly unremarkable kidneys.  Nonvisualization of aorta and pancreas.  Small to moderate amount ascites.   Question of hepatic nodularity was raised by the performing  technologist though this is not definitely confirmed on the  obtained images; if there is clinical concern for cirrhosis  consider CT imaging with contrast.   Plan: Cade Dashner is a 42 y.o. year old male with no reported past medical history presenting with with one day history of urinary retention and found to have grossly bloody urine on cath in ED, additionally with tooth pain and swelling.   Gross hematuria: Bladder perforation - New onset after cath in the ED, due to bladder perforation now POD 5 from Open cystorrhaphy and exploratory laparotomy - urology consulting- appreciate their reccs  - Repeat cystogram without evidence of bladder leakage  - Post op ileus continues, discussed ambulation with patient   - Restarted rocephin-will continue through today, then back to penn v k for tooth - General surgery consulting- appreciate reccs  - Agree with reglan and NG tube, but patient agrees to defer until he is nauseated and/or vomiting - Hgb stable 10.3 this am - Morphine 2-4 mgh IV q2 PRN for pain, 5-10 oxycodone Q4 prn- will decrease morphine time interval today - Some new moderate drainage where JP was pulled, spoke with urology and advised to watch and wait since recent CT showed no evidence of bladder rupture.   - I agree and will contact ostomy nurse for a small ostomy bag to be placed over it if drainage continues to be a problem per urology.   Post Op ileus -  Hypoactive BS, flatus increasing - continue sips and chips for now - Hold off on NGT for now (no vomiting) - Started reglan - Gen surg following- appreciate reccs - Ambulate  AKI - Cr of 3.00 on admission, now normalzing - Urology consulting, appreciate reccs  - repeat UA shows positive nitrites, large LE, large Hgb, gram stain neg for bact, UCx pending - Rocephin - today last day- 6 total - on Flomax - Avoid NSAIDs and other nephrotoxic agents as  possible - NS 150 mL/hr  Dental pain- acute pulpitis and dental carie of tooth #17 - IP dentist consulting- appreciate reccs  - Will need oral surgeon to remove tooth- OMFS not IP so will refer OP.  - cracked tooth, orthopantogram shows possible involvement of posterior L molar - now rocephin for today = 6 days total, will add back penn v k after that - pain control per above  High blood pressure: with tachycardia - no history of HTN per patient but BP elevated to 144-154/93-109 - Tachycardia 99-100 improved - metoprolol 25 BID and amlodipine, PRN hydralazine with parameters  FEN/GI: sips an dchips until bowel function returns,  NS @ 150 mL/hr  Prophylaxis: SCD's, no VTE pharmacologic prophylaxis given question of active urinary bleed  Disposition: pending improvement of post-op ileus, tolerating PO, pain control  Code Status: full   Kevin Fenton, MD 05/15/2012, 9:17 AM

## 2012-05-15 NOTE — Progress Notes (Signed)
5 Days Post-Op  Subjective: Pt states more hiccups and burping.  Minimal flatus reported.  No BM.  No n/v  Objective: Vital signs in last 24 hours: Temp:  [97.3 F (36.3 C)-97.6 F (36.4 C)] 97.6 F (36.4 C) (02/08 2121) Pulse Rate:  [99-100] 100 (02/08 2121) Resp:  [20] 20 (02/08 2121) BP: (144-154)/(93-109) 144/93 mmHg (02/08 2121) SpO2:  [94 %] 94 % (02/08 2121) Last BM Date: 05/09/12  Intake/Output from previous day: 02/08 0701 - 02/09 0700 In: 2887.5 [I.V.:2787.5; IV Piggyback:100] Out: 1150 [Urine:1150] Intake/Output this shift:    General appearance: alert and cooperative GI: abd distended, min BS, no guarding or rebound  Lab Results:   Recent Labs  05/14/12 0540 05/15/12 0540  WBC 2.2* 2.8*  HGB 10.5* 10.3*  HCT 30.8* 29.9*  PLT 301 282   BMET  Recent Labs  05/14/12 0540 05/15/12 0540  NA 134* 135  K 3.5 3.4*  CL 101 100  CO2 25 23  GLUCOSE 93 75  BUN 17 15  CREATININE 0.91 0.89  CALCIUM 8.8 8.7   PT/INR No results found for this basename: LABPROT, INR,  in the last 72 hours ABG No results found for this basename: PHART, PCO2, PO2, HCO3,  in the last 72 hours  Studies/Results: Dg Chest 2 View  05/13/2012  *RADIOLOGY REPORT*  Clinical Data: Hypoxia.  History of exploratory laparotomy 3 days ago  CHEST - 2 VIEW  Comparison: None.  Findings: Low lung volumes are present.  The patient is in a semi- erect position and this combined with low lung volumes accentuates the cardiac contour but this is felt likely to be within normal limits.  Ectasia of the thoracic aorta is suggested but may also be accentuated by positioning.  The lung fields appear clear with the exception of crowding of bronchovascular markings at both lung bases due to low lung volumes.  No pleural fluid or focal infiltrates are seen.  No pulmonary vascular congestion or pulmonary edema is noted.  The visualized portion of the bowel gas pattern reveals several mildly dilated bowel loops in  the upper abdomen and air fluid levels on the lateral view. This can be more fully evaluated with an abdominal series if felt clinically indicated.  IMPRESSION: Low lung volumes with crowding of bronchovascular markings at the lung bases and no worrisome focal or acute abnormality suggested.  Mildly dilated small bowel loops with air fluid levels suggested in the visualized portion of the abdomen. Full evaluation if indicated clinically can be obtained with flat plate and upright views of the abdomen.   Original Report Authenticated By: Rhodia Albright, M.D.     Anti-infectives: Anti-infectives   Start     Dose/Rate Route Frequency Ordered Stop   05/13/12 0800  cefTRIAXone (ROCEPHIN) 1 g in dextrose 5 % 50 mL IVPB     1 g 100 mL/hr over 30 Minutes Intravenous Every 24 hours 05/13/12 0719     05/11/12 1700  penicillin v potassium (VEETID) tablet 500 mg  Status:  Discontinued     500 mg Oral 4 times per day 05/11/12 1140 05/13/12 0731   05/10/12 2200  ciprofloxacin (CIPRO) IVPB 400 mg  Status:  Discontinued     400 mg 200 mL/hr over 60 Minutes Intravenous Every 12 hours 05/10/12 1813 05/10/12 2237   05/10/12 1300  cefTRIAXone (ROCEPHIN) 1 g in dextrose 5 % 50 mL IVPB     1 g 100 mL/hr over 30 Minutes Intravenous Every 24 hours 05/09/12  1936 05/11/12 1406   05/09/12 1300  cefTRIAXone (ROCEPHIN) 1 g in dextrose 5 % 50 mL IVPB     1 g 100 mL/hr over 30 Minutes Intravenous  Once 05/09/12 1249 05/09/12 1349      Assessment/Plan: s/p Procedure(s) with comments: exploratory laparotomy for bladder perforation, cystoscopy (N/A) - exploratory laparotomy for bladder perforation, cystoscopy Ileus -Con't conservative mgmt -Cont to mobilize -K+ replacement    LOS: 6 days    Marigene Ehlers., Jed Limerick 05/15/2012

## 2012-05-16 LAB — CBC WITH DIFFERENTIAL/PLATELET
Basophils Absolute: 0 10*3/uL (ref 0.0–0.1)
Basophils Relative: 1 % (ref 0–1)
Eosinophils Absolute: 0.1 10*3/uL (ref 0.0–0.7)
Hemoglobin: 10.2 g/dL — ABNORMAL LOW (ref 13.0–17.0)
MCH: 30.2 pg (ref 26.0–34.0)
MCHC: 35.1 g/dL (ref 30.0–36.0)
Monocytes Relative: 19 % — ABNORMAL HIGH (ref 3–12)
Neutro Abs: 1.2 10*3/uL — ABNORMAL LOW (ref 1.7–7.7)
Neutrophils Relative %: 55 % (ref 43–77)
Platelets: 300 10*3/uL (ref 150–400)
RDW: 11.4 % — ABNORMAL LOW (ref 11.5–15.5)

## 2012-05-16 LAB — BASIC METABOLIC PANEL
Chloride: 99 mEq/L (ref 96–112)
GFR calc Af Amer: >90 mL/min (ref >90)
GFR calc non Af Amer: >90 mL/min (ref >90)
Potassium: 3.3 mEq/L — ABNORMAL LOW (ref 3.5–5.1)
Sodium: 135 mEq/L (ref 135–145)

## 2012-05-16 LAB — HIV ANTIBODY (ROUTINE TESTING W REFLEX): HIV: NONREACTIVE

## 2012-05-16 MED ORDER — PENICILLIN V POTASSIUM 500 MG PO TABS
500.0000 mg | ORAL_TABLET | Freq: Four times a day (QID) | ORAL | Status: DC
  Administered 2012-05-16 – 2012-05-20 (×17): 500 mg via ORAL
  Filled 2012-05-16 (×20): qty 1

## 2012-05-16 MED ORDER — POTASSIUM CHLORIDE CRYS ER 20 MEQ PO TBCR
40.0000 meq | EXTENDED_RELEASE_TABLET | Freq: Two times a day (BID) | ORAL | Status: AC
  Administered 2012-05-16 (×2): 40 meq via ORAL
  Filled 2012-05-16 (×2): qty 2

## 2012-05-16 MED ORDER — POLYETHYLENE GLYCOL 3350 17 G PO PACK
17.0000 g | PACK | Freq: Every day | ORAL | Status: DC
  Administered 2012-05-16 – 2012-05-18 (×3): 17 g via ORAL
  Filled 2012-05-16 (×3): qty 1

## 2012-05-16 MED ORDER — TRAMADOL HCL 50 MG PO TABS
50.0000 mg | ORAL_TABLET | Freq: Four times a day (QID) | ORAL | Status: DC
  Administered 2012-05-16 – 2012-05-20 (×18): 50 mg via ORAL
  Filled 2012-05-16 (×22): qty 1

## 2012-05-16 NOTE — Progress Notes (Signed)
FMTS Attending Daily Note: Caylon Saine MD [phone] pager office [phone] I  have seen and examined this patient, reviewed their chart. I have discussed this patient with the resident. I agree with the resident's findings, assessment and care plan. 

## 2012-05-16 NOTE — Progress Notes (Signed)
Family Medicine Teaching Service Daily Progress Note Service Page: [phone]  Subjective:  Post op pain managed pretty well. Continued flatus, abdomen feels less distended, no nausea or vomiting  Objective: Temp:  [97.9 F (36.6 C)-100 F (37.8 C)] 98.1 F (36.7 C) (02/10 0700) Pulse Rate:  [98-123] 98 (02/10 0700) Resp:  [16-24] 16 (02/10 0700) BP: (141-168)/(67-110) 141/67 mmHg (02/10 0700) SpO2:  [89 %-97 %] 97 % (02/10 0700) Exam: Gen: NAD, alert, cooperative with exam HEENT: NCAT, EOMI, left lower jaw posterior tooth with apparent crack in it CV: RRR, good S1/S2, no murmur Resp: CTABL, no wheezes, non-labored Abd: mildly tender and distended, Clean dry intact wound on lower abdomen, bandages Clean dry and intact X 2 on RU and LU quads. + BS but hypoactive, L groin drain sight without erythema or induration expresses moderate serosanginous fluid with pressure.  Ext: No edema, warm Neuro: Alert and oriented, No gross deficits   I have reviewed the patient's medications, labs, imaging, and diagnostic testing.  Notable results are summarized below.  CBC BMET   Recent Labs Lab 05/14/12 0540 05/15/12 0540 05/16/12 0630  WBC 2.2* 2.8* 2.4*  HGB 10.5* 10.3* 10.2*  HCT 30.8* 29.9* 29.1*  PLT 301 282 300    Recent Labs Lab 05/14/12 0540 05/15/12 0540 05/16/12 0630  NA 134* 135 135  K 3.5 3.4* 3.3*  CL 101 100 99  CO2 25 23 25   BUN 17 15 12   CREATININE 0.91 0.89 0.92  GLUCOSE 93 75 73  CALCIUM 8.8 8.7 8.7     Imaging/Diagnostic Tests: DG Orthopantogram 05/09/2012 IMPRESSION:  The cracked tooth may involve the posterior left mandibular molar  as the crown appears irregular.  Complete Abd [place] 05/09/2012 IMPRESSION:  Grossly unremarkable kidneys.  Nonvisualization of aorta and pancreas.  Small to moderate amount ascites.  Question of hepatic nodularity was raised by the performing  technologist though this is not definitely confirmed on the  obtained images; if  there is clinical concern for cirrhosis  consider CT imaging with contrast.   Plan: Luie Laneve is a 42 y.o. year old male with no reported past medical history presenting with with one day history of urinary retention and found to have grossly bloody urine on cath in ED, additionally with tooth pain and swelling.   Gross hematuria: Bladder perforation - New onset after cath in the ED, due to bladder perforation now POD 6 (2/4) from Open cystorrhaphy and exploratory laparotomy - urology consulting- appreciate their reccs  - Repeat cystogram without evidence of bladder leakage  - Post op ileus continues, discussed ambulation with patient   - Restarted rocephin-will continue through today, then back to penn v k for tooth  - Feel drainage not likely urine, most likely 3rd spacing that will improve with ileus - General surgery consulting- appreciate reccs  - Agree with reglan and NG tube, but patient agrees to defer until he is nauseated and/or vomiting - Hgb stable 10.3 this am - Morphine 2 mgh IV spaced to q4 PRN for pain, 5-10 oxycodone Q4 prn, scheduled 50 ultram Q6 to try and wean narcotics   Post Op ileus - Improving flatus increasing - Hold off on NGT for now (no vomiting) - Started reglan - Gen surg following- appreciate reccs - advance to clears, ambultae  AKI - Cr of 3.00 on admission, now normalzing - Urology consulting, appreciate reccs  - repeat UA shows positive nitrites, large LE, large Hgb, gram stain neg for bact, UCx pending -  Rocephin - today last day- 6 total - on Flomax - Avoid NSAIDs and other nephrotoxic agents as possible - NS 150 mL/hr  Dental pain- acute pulpitis and dental carie of tooth #17 - IP dentist consulting- appreciate reccs  - Will need oral surgeon to remove tooth- OMFS not IP so will refer OP.  - cracked tooth, orthopantogram shows possible involvement of posterior L molar - rocephin completed at 6 days total, added penn v k  - pain control  per above  High blood pressure: with tachycardia- improved - BP 141/67, P98 - No Hx of HTN per pt - Tachycardia 99-100 improved - metoprolol 50 BID and amlodipine, PRN hydralazine with parameters not used in 3 days  Hypokalemia - K 3.4 on 2/9, 40 KCL given IV, 3.2 today so will replace with 40 PO X2  Leukopenia, Anemia - stable X 3 days - WBC 2.4 X neuts of 55% = ANC of 1300 - Hemoglobin trending down 11.7 post op down to 10.2 today - will monitor  FEN/GI: clears, will advance carefully as tolerated,  NS @ 150 mL/hr  Prophylaxis: sub q heparin Disposition: pending improvement of post-op ileus, tolerating PO, pain control  Code Status: full   Kevin Fenton, MD 05/16/2012, 9:06 AM

## 2012-05-16 NOTE — Progress Notes (Signed)
Urology Progress Note  Subjective:     No acute urologic events overnight. Negative nausea or emesis. Positive flatus. Negative BM. Ambulating. Positive serous drainage from JP site.  ROS: Negative: Chest pain, SOB  Objective:  Patient Vitals for the past 24 hrs:  BP Temp Temp src Pulse Resp SpO2  05/15/12 2120 150/90 mmHg 97.9 F (36.6 C) Oral 100 18 96 %  05/15/12 1625 - - - - - 94 %  05/15/12 1624 - 97.9 F (36.6 C) Oral 105 18 90 %  05/15/12 1400 160/90 mmHg - - 102 18 96 %  05/15/12 1300 168/110 mmHg 100 F (37.8 C) Oral 123 24 89 %    Physical Exam: General:  No acute distress, awake Cardiovascular:    [x]   S1/S2 present, RRR  []   Irregularly irregular Chest:  CTA-B, no crackles Abdomen:               []  Soft, appropriately TTP  []  Soft, NTTP  [x]  Soft, appropriately TTP, negative rebound TTP, abdominal distention decreased to mild, incision(s) c/d/i, positive blisters dressed, JP site serous drainage.  Genitourinary: Uncircumcised. Positive genital edema. Negative paraphimosis Foley: Present. Draining clear yellow urine.    I/O last 3 completed shifts: In: 5397.5 [P.O.:90; I.V.:4857.5; IV Piggyback:450] Out: 2650 [Urine:2650]  Urine output over the past 24 hours: 950cc  Recent Labs     05/14/12  0540  05/15/12  0540  HGB  10.5*  10.3*  WBC  2.2*  2.8*  PLT  301  282    Recent Labs     05/14/12  0540  05/15/12  0540  NA  134*  135  K  3.5  3.4*  CL  101  100  CO2  25  23  BUN  17  15  CREATININE  0.91  0.89  CALCIUM  8.8  8.7  GFRNONAA  >90  >90  GFRAA  >90  >90     No results found for this basename: PT, INR, APTT,  in the last 72 hours   No components found with this basename: ABG,     Length of stay: 7 days.  Assessment: Intraperitoneal bladder perforation. Now with ileus. 05/10/12: Open cystorrhaphy and exploratory laparotomy   Plan: -Scrotal support for genital edema. This will resolve once his fluids start to mobilize and his  ileus resolves. -Fluid from JP site is highly unlikely to be urine; this is likely due to 3rd spacing of fluid (also the cause of his genital edema) and should improve greatly once his ileus resolves and his colon can reabsorb his fluid. -Continue expectant management. Advance diet since he has bowel activity.  Natalia Leatherwood, MD [phone]

## 2012-05-16 NOTE — Progress Notes (Signed)
Patient ID: Adam Wagner, male   DOB: 1970/10/26, 42 y.o.   MRN: [id number]   LOS: 7 days   Subjective: Denies N/V. +flatus, no BM. Thinks bloating is lessened. Minimal pain.  Objective: Vital signs in last 24 hours: Temp:  [97.9 F (36.6 C)-100 F (37.8 C)] 98.1 F (36.7 C) (02/10 0700) Pulse Rate:  [98-123] 98 (02/10 0700) Resp:  [16-24] 16 (02/10 0700) BP: (141-168)/(67-110) 141/67 mmHg (02/10 0700) SpO2:  [89 %-97 %] 97 % (02/10 0700) Last BM Date: 05/09/12   General appearance: alert and no distress Resp: clear to auscultation bilaterally Cardio: regular rate and rhythm GI: Soft, scant BS. Mild distension. Incision C/D/I.   Assessment/Plan: Intraperitoneal bladder rupture Ileus -- Continue prokinetic, will advance to clears and see how he does. Will add scheduled tramadol to try and reduce narcotics.    Freeman Caldron, PA-C Pager: [phone] General Trauma PA Pager: [phone]   05/16/2012

## 2012-05-17 LAB — BASIC METABOLIC PANEL
BUN: 12 mg/dL (ref 6–23)
Chloride: 100 mEq/L (ref 96–112)
GFR calc Af Amer: >90 mL/min (ref >90)
GFR calc non Af Amer: >90 mL/min (ref >90)
Potassium: 3.6 mEq/L (ref 3.5–5.1)
Sodium: 136 mEq/L (ref 135–145)

## 2012-05-17 LAB — CBC
HCT: 29.7 % — ABNORMAL LOW (ref 39.0–52.0)
Hemoglobin: 10.4 g/dL — ABNORMAL LOW (ref 13.0–17.0)
MCHC: 35 g/dL (ref 30.0–36.0)
RDW: 11.5 % (ref 11.5–15.5)
WBC: 3.3 10*3/uL — ABNORMAL LOW (ref 4.0–10.5)

## 2012-05-17 LAB — MAGNESIUM: Magnesium: 2.1 mg/dL (ref 1.5–2.5)

## 2012-05-17 MED ORDER — METOCLOPRAMIDE HCL 10 MG PO TABS
10.0000 mg | ORAL_TABLET | Freq: Three times a day (TID) | ORAL | Status: DC
  Administered 2012-05-17 – 2012-05-18 (×8): 10 mg via ORAL
  Filled 2012-05-17 (×11): qty 1

## 2012-05-17 MED ORDER — OXYCODONE HCL 5 MG PO TABS
5.0000 mg | ORAL_TABLET | ORAL | Status: DC | PRN
  Administered 2012-05-17 (×2): 5 mg via ORAL
  Filled 2012-05-17 (×2): qty 1

## 2012-05-17 NOTE — Progress Notes (Signed)
Patient ID: Adam Wagner, male   DOB: 1971/03/20, 42 y.o.   MRN: [id number]   LOS: 8 days  POD#7  Subjective: Denies N/V. +flatus, +belching. Has drunk only a small-moderate amount of clears.  Objective: Vital signs in last 24 hours: Temp:  [98.2 F (36.8 C)-98.3 F (36.8 C)] 98.2 F (36.8 C) (02/11 0544) Pulse Rate:  [83-95] 83 (02/11 0544) Resp:  [16-18] 16 (02/11 0544) BP: (140-167)/(89-107) 151/101 mmHg (02/11 0544) SpO2:  [90 %-97 %] 97 % (02/11 0544) Last BM Date: 05/09/12   Lab Results:  CBC  Recent Labs  05/16/12 0630 05/17/12 0644  WBC 2.4* 3.3*  HGB 10.2* 10.4*  HCT 29.1* 29.7*  PLT 300 325    General appearance: alert and no distress Resp: clear to auscultation bilaterally Cardio: regular rate and rhythm GI: Soft, +BS, slightly diminished. Incision C/D/I. JP site draining copious yellow fluid, no odor.   Assessment/Plan: Intraperitoneal bladder rupture Ileus -- Continue prokinetic, will advance to fulls. If he does not tolerate advancement will obtain CT scan abd/pelvis. Did well with tramadol, has avoided narcotics x24h.    Freeman Caldron, PA-C Pager: [phone] General Trauma PA Pager: [phone]   05/17/2012

## 2012-05-17 NOTE — Progress Notes (Signed)
Patient examined and I agree with the assessment and plan Passing gas and still feels a bit full.  Try full liquids.  Noted serous D/C from JP site. Violeta Gelinas, MD, MPH, FACS Pager: [phone]  05/17/2012 9:52 AM

## 2012-05-17 NOTE — Progress Notes (Signed)
FMTS Attending Daily Note: Adam Halpin MD [phone] pager office [phone] I have discussed this patient with the resident and reviewed the assessment and plan as documented above. I agree wit the resident's findings and plan.  

## 2012-05-17 NOTE — Progress Notes (Signed)
Family Medicine Teaching Service Daily Progress Note Service Page: [phone]  Subjective:  Post op pain managed pretty well wioth addition of tramadol. Denies n/v but does note 2 episodes of near-emesis. + flatus and + belching. Abd feels less tight.  Objective: Temp:  [98.2 F (36.8 C)-98.3 F (36.8 C)] 98.2 F (36.8 C) (02/11 0544) Pulse Rate:  [83-95] 83 (02/11 0544) Resp:  [16-18] 16 (02/11 0544) BP: (140-167)/(89-107) 151/101 mmHg (02/11 0544) SpO2:  [90 %-97 %] 97 % (02/11 0544) Exam: Gen: NAD, alert, cooperative with exam HEENT: NCAT, EOMI, left lower jaw posterior tooth with apparent crack in it CV: RRR, good S1/S2, no murmur Resp: CTABL, no wheezes, non-labored Abd: mildly tender and distended, Clean dry intact wound on lower abdomen, bandages Clean dry and intact X 2 on RU and LU quads. + BS normoactive, L groin drain sight without erythema or induration expresses moderate serosanginous fluid with pressure.  Ext: No edema, warm Neuro: Alert and oriented, No gross deficits   I have reviewed the patient's medications, labs, imaging, and diagnostic testing.  Notable results are summarized below.  CBC BMET   Recent Labs Lab 05/15/12 0540 05/16/12 0630 05/17/12 0644  WBC 2.8* 2.4* 3.3*  HGB 10.3* 10.2* 10.4*  HCT 29.9* 29.1* 29.7*  PLT 282 300 325    Recent Labs Lab 05/15/12 0540 05/16/12 0630 05/17/12 0644  NA 135 135 136  K 3.4* 3.3* 3.6  CL 100 99 100  CO2 23 25 25   BUN 15 12 12   CREATININE 0.89 0.92 0.86  GLUCOSE 75 73 66*  CALCIUM 8.7 8.7 8.7     Imaging/Diagnostic Tests: DG Orthopantogram 05/09/2012 IMPRESSION:  The cracked tooth may involve the posterior left mandibular molar  as the crown appears irregular.  Complete Abd [place] 05/09/2012 IMPRESSION:  Grossly unremarkable kidneys.  Nonvisualization of aorta and pancreas.  Small to moderate amount ascites.  Question of hepatic nodularity was raised by the performing  technologist though this is not  definitely confirmed on the  obtained images; if there is clinical concern for cirrhosis  consider CT imaging with contrast.   Plan: Yosef Krogh is a 42 y.o. year old male with no reported past medical history presenting with with one day history of urinary retention and found to have grossly bloody urine on cath in ED, additionally with tooth pain and swelling.   Gross hematuria: Bladder perforation - New onset after cath in the ED, due to bladder perforation now POD 6 (2/4) from Open cystorrhaphy and exploratory laparotomy - urology consulting- appreciate their reccs  - Repeat cystogram without evidence of bladder leakage  - Post op ileus continues, discussed ambulation with patient   - Restarted rocephin-will continue through today, then back to penn v k for tooth  - Feel drainage not likely urine, most likely 3rd spacing that will improve with ileus - General surgery consulting- appreciate reccs  - Agree with reglan and NG tube, patient agrees to defer until he is nauseated and/or vomiting - Pain: using much less narcotics since addition of ultram, d/c Morphine 2 mgh IV spaced to q4 PRN for pain, continue 5 oxycodone Q4 prn, scheduled 50 ultram Q6 to try and wean narcotics   Post Op ileus - Improving flatus increasing - Hold off on NGT for now (no vomiting) - Started reglan - Gen surg following- appreciate reccs - advance to fulls, ambultae, will CT abd and pelvis if doesn't tolerate well per surgery reccs.    AKI - Cr of  3.00 on admission, now normalzing - Urology consulting, appreciate reccs  - repeat UA shows positive nitrites, large LE, large Hgb, gram stain neg for bact, UCx pending - Rocephin - 6 days  Total given, now on Penn vk for oral coverage - on Flomax - Avoid NSAIDs and other nephrotoxic agents as possible - NS 150 mL/hr  Dental pain- acute pulpitis and dental carie of tooth #17 - IP dentist consulting- appreciate reccs  - Will need oral surgeon to remove  tooth- OMFS not IP so will refer OP.  - cracked tooth, orthopantogram shows possible involvement of posterior L molar - rocephin completed at 6 days total, added penn v k  - pain control per above  High blood pressure: with tachycardia- improved - BP 151/101, P83 - No Hx of HTN per pt - metoprolol 50 BID and amlodipine, PRN hydralazine with parameters not used in 3 days  Hypokalemia - K 3.6 today - Add on magnesium  Leukopenia, Anemia - stable X 4 days trending up - WBC 2.4 X neuts of 55% = ANC of 1300 on 2/10 - Hemoglobin trending down 11.7 post op down to 10.4 today, now stable around 10.5 for 3 days - will monitor, no acute intervention necessary  FEN/GI: clears, will advance carefully as tolerated,  NS @ 150 mL/hr  Prophylaxis: sub q heparin Disposition: pending improvement of post-op ileus, tolerating PO, pain control  Code Status: full   Kevin Fenton, MD 05/17/2012, 8:51 AM

## 2012-05-18 LAB — CBC
Hemoglobin: 9.6 g/dL — ABNORMAL LOW (ref 13.0–17.0)
Platelets: 321 10*3/uL (ref 150–400)
RBC: 3.21 MIL/uL — ABNORMAL LOW (ref 4.22–5.81)
WBC: 4.6 10*3/uL (ref 4.0–10.5)

## 2012-05-18 LAB — BASIC METABOLIC PANEL
CO2: 25 mEq/L (ref 19–32)
Calcium: 8.2 mg/dL — ABNORMAL LOW (ref 8.4–10.5)
GFR calc non Af Amer: >90 mL/min (ref >90)
Glucose, Bld: 73 mg/dL (ref 70–99)
Potassium: 3.6 mEq/L (ref 3.5–5.1)
Sodium: 134 mEq/L — ABNORMAL LOW (ref 135–145)

## 2012-05-18 MED ORDER — POLYETHYLENE GLYCOL 3350 17 G PO PACK
17.0000 g | PACK | Freq: Two times a day (BID) | ORAL | Status: DC
  Administered 2012-05-18 – 2012-05-20 (×4): 17 g via ORAL
  Filled 2012-05-18 (×6): qty 1

## 2012-05-18 NOTE — Progress Notes (Signed)
FMTS Attending Daily Note: Sara Neal MD [phone] pager office [phone] I  have seen and examined this patient, reviewed their chart. I have discussed this patient with the resident. I agree with the resident's findings, assessment and care plan. 

## 2012-05-18 NOTE — Progress Notes (Signed)
. Family Medicine Teaching Service Daily Progress Note Service Page: [phone]  Subjective:  Post op pain managed well. Tolerating PO pretty well. Eating grits this am. Worried about getting his tooth taken care of. Wasn't eating well previously 2/2 tooth pain.   Objective: Temp:  [98 F (36.7 C)-98.7 F (37.1 C)] 98.2 F (36.8 C) (02/12 0609) Pulse Rate:  [81-93] 92 (02/12 0609) Resp:  [18] 18 (02/12 0609) BP: (148-159)/(95-101) 149/95 mmHg (02/12 0609) SpO2:  [95 %-96 %] 96 % (02/12 0609) Exam: Gen: NAD, alert, cooperative with exam HEENT: NCAT, EOMI, left lower jaw posterior tooth with apparent crack in it CV: RRR, good S1/S2, no murmur Resp: CTABL, no wheezes, non-labored Abd: mildly tender and distended, Clean dry intact wound on lower abdomen, bandages Clean dry and intact X 2 on RU and LU quads. + BS normoactive, L groin drain sight without erythema or induration expresses moderate serosanginous fluid with pressure.  GU: Scrotum grossly swollen Ext: No edema, warm Neuro: Alert and oriented, No gross deficits   I have reviewed the patient's medications, labs, imaging, and diagnostic testing.  Notable results are summarized below.  CBC BMET   Recent Labs Lab 05/16/12 0630 05/17/12 0644 05/18/12 0515  WBC 2.4* 3.3* 4.6  HGB 10.2* 10.4* 9.6*  HCT 29.1* 29.7* 27.6*  PLT 300 325 321    Recent Labs Lab 05/16/12 0630 05/17/12 0644 05/18/12 0515  NA 135 136 134*  K 3.3* 3.6 3.6  CL 99 100 100  CO2 25 25 25   BUN 12 12 11   CREATININE 0.92 0.86 0.95  GLUCOSE 73 66* 73  CALCIUM 8.7 8.7 8.2*     Imaging/Diagnostic Tests: DG Orthopantogram 05/09/2012 IMPRESSION:  The cracked tooth may involve the posterior left mandibular molar  as the crown appears irregular.  Complete Abd [place] 05/09/2012 IMPRESSION:  Grossly unremarkable kidneys.  Nonvisualization of aorta and pancreas.  Small to moderate amount ascites.  Question of hepatic nodularity was raised by the  performing  technologist though this is not definitely confirmed on the  obtained images; if there is clinical concern for cirrhosis  consider CT imaging with contrast.   Plan: Adam Wagner is a 42 y.o. year old male with no reported past medical history presenting with with one day history of urinary retention and found to have grossly bloody urine on cath in ED, additionally with tooth pain and swelling.   Gross hematuria: Bladder perforation - New onset after cath in the ED, due to bladder perforation now POD 8 (2/4) from Open cystorrhaphy and exploratory laparotomy - urology consulting- appreciate their reccs  - Repeat cystogram without evidence of bladder leakage  - Post op ileus continues, discussed ambulation with patient   - Restarted rocephin-will continue through today, then back to penn v k for tooth  - Feel drainage not likely urine, most likely 3rd spacing that will improve with ileus - General surgery consulting- appreciate reccs  - Agree with reglan and NG tube, patient agrees to defer until he is nauseated and/or vomiting - Pain: using much less narcotics since addition of ultram, d/c Morphine 2 mgh IV spaced to q4 PRN for pain, continue 5 oxycodone Q4 prn, scheduled 50 ultram Q6 to try and wean narcotics - Will consider repeat CT with decrease in Hgb   Post Op ileus - Improving flatus increasing - Hold off on NGT for now (no vomiting) - Started reglan - Gen surg following- appreciate reccs - advance to regular diet, ambultae, will CT abd  and pelvis if doesn't tolerate well per surgery reccs.    AKI - Cr of 3.00 on admission, now normalzing - Urology consulting, appreciate reccs  - repeat UA shows positive nitrites, large LE, large Hgb, gram stain neg for bact, UCx pending - Rocephin - 6 days  Total given, now on Penn vk for oral coverage - on Flomax - Avoid NSAIDs and other nephrotoxic agents as possible - NS 150 mL/hr  Dental pain- acute pulpitis and dental  carie of tooth #17 - IP dentist consulting- appreciate reccs  - Will need oral surgeon to remove tooth- OMFS not IP so will refer OP.  - cracked tooth, orthopantogram shows possible involvement of posterior L molar - rocephin completed at 6 days total, added penn v k  - pain control per above  High blood pressure: with tachycardia- improved - BP 149/95, P92 - No Hx of HTN per pt - metoprolol 50 BID and amlodipine, PRN hydralazine with parameters not used in 3 days  Hypokalemia - K 3.6 today - Mag WNL  Leukopenia, Anemia - trending up - WBC 2.4 X neuts of 55% = ANC of 1300 on 2/10, increased to 4.6 today - Hemoglobin trending down 11.7 post op down to 10.4 to 9.6 today  - will monitor, repeat H/H, consider repeat CT  FEN/GI: clears, will advance carefully as tolerated,  NS @ 150 mL/hr  Prophylaxis: sub q heparin Disposition: Home if tolerates PO, pending decrease in Hgb Code Status: full   Kevin Fenton, MD 05/18/2012, 7:32 AM

## 2012-05-18 NOTE — Progress Notes (Signed)
Urology Progress Note  Subjective:     No acute urologic events overnight. Subjectively feels less distended. Denies nausea or emesis. Positive flatus. Negative BM. Positive ambulation. Positive genital edema; states he has not been using scrotal support. Drainage from JP site has decreased.  ROS: Negative: Chest pain, SOB  Objective:  Patient Vitals for the past 24 hrs:  BP Temp Temp src Pulse Resp SpO2  05/18/12 0609 149/95 mmHg 98.2 F (36.8 C) Oral 92 18 96 %  05/17/12 2159 159/98 mmHg 98 F (36.7 C) Oral 93 18 96 %  05/17/12 1459 148/101 mmHg 98.7 F (37.1 C) - 81 18 95 %    Physical Exam: General:  No acute distress, awake Cardiovascular:    [x]   S1/S2 present, RRR  []   Irregularly irregular Chest:  CTA-B, no crackles Abdomen:               []  Soft, appropriately TTP  []  Soft, NTTP  [x]  Soft, appropriately TTP, negative rebound TTP, abdominal distention decreased to mild, incision(s) c/d/i, positive blisters dressed, JP site serous drainage.  Genitourinary: Uncircumcised. Positive genital edema. Negative paraphimosis Foley: Present. Draining clear yellow urine.    I/O last 3 completed shifts: In: 5636.3 [P.O.:420; I.V.:5216.3] Out: 4050 [Urine:4050]    Recent Labs     05/17/12  0644  05/18/12  0515  HGB  10.4*  9.6*  WBC  3.3*  4.6  PLT  325  321    Recent Labs     05/17/12  0644  05/18/12  0515  NA  136  134*  K  3.6  3.6  CL  100  100  CO2  25  25  BUN  12  11  CREATININE  0.86  0.95  CALCIUM  8.7  8.2*  GFRNONAA  >90  >90  GFRAA  >90  >90     No results found for this basename: PT, INR, APTT,  in the last 72 hours   No components found with this basename: ABG,     Length of stay: 9 days.  Assessment: Intraperitoneal bladder perforation. Now with ileus. 05/10/12: Open cystorrhaphy and exploratory laparotomy   Plan: -Scrotal support for genital edema. Again, I have encouraged scrotal support. This should continue to improve as he  mobilizes fluids. He has had an increase in urine output, which is good.  -JP site output should continue to decrease.  -Continue foley catheter.  -If tolerated full liquids, would advance to regular diet.  -Will continue to follow. He has f/u already scheduled with me as an outpatient. I have placed this in the discharge section already.  Natalia Leatherwood, MD [phone]

## 2012-05-18 NOTE — Progress Notes (Signed)
Patient ID: Adam Wagner, male   DOB: Jun 05, 1970, 42 y.o.   MRN: [id number]   LOS: 9 days   Subjective: Tolerated full liquids yesterday, denies N/V. +flatus.  Objective: Vital signs in last 24 hours: Temp:  [98 F (36.7 C)-98.7 F (37.1 C)] 98.2 F (36.8 C) (02/12 0609) Pulse Rate:  [81-93] 92 (02/12 0609) Resp:  [18] 18 (02/12 0609) BP: (148-159)/(95-101) 149/95 mmHg (02/12 0609) SpO2:  [95 %-96 %] 96 % (02/12 0609) Last BM Date: 05/09/12   Lab Results:  CBC  Recent Labs  05/17/12 0644 05/18/12 0515  WBC 3.3* 4.6  HGB 10.4* 9.6*  HCT 29.7* 27.6*  PLT 325 321   BMET  Recent Labs  05/17/12 0644 05/18/12 0515  NA 136 134*  K 3.6 3.6  CL 100 100  CO2 25 25  GLUCOSE 66* 73  BUN 12 11  CREATININE 0.86 0.95  CALCIUM 8.7 8.2*    General appearance: alert and no distress Resp: clear to auscultation bilaterally Cardio: regular rate and rhythm GI: normal findings: bowel sounds normal, soft, non-tender and incision C/D/I   Assessment/Plan: Intraperitoneal bladder rupture Ileus -- Continue prokinetic, will advance to regular diet. My only concern is if he's taking in sufficient amounts. If he tolerates that he could be discharged from our standpoint.    Freeman Caldron, PA-C Pager: [phone] General Trauma PA Pager: [phone]   05/18/2012

## 2012-05-18 NOTE — Progress Notes (Signed)
Did not eat much of regular tray this morning.  Very cautious about eating.  No changes for now.  Still has not had a bowel movement.  This patient has been seen and I agree with the findings and treatment plan.  Marta Lamas. Gae Bon, MD, FACS [phone] (pager) [phone] (direct pager) Trauma Surgeon

## 2012-05-19 LAB — BASIC METABOLIC PANEL
CO2: 27 mEq/L (ref 19–32)
Chloride: 99 mEq/L (ref 96–112)
Glucose, Bld: 82 mg/dL (ref 70–99)
Sodium: 134 mEq/L — ABNORMAL LOW (ref 135–145)

## 2012-05-19 LAB — CBC
HCT: 27.4 % — ABNORMAL LOW (ref 39.0–52.0)
Hemoglobin: 9.6 g/dL — ABNORMAL LOW (ref 13.0–17.0)
MCH: 29.8 pg (ref 26.0–34.0)
MCV: 85.1 fL (ref 78.0–100.0)
RBC: 3.22 MIL/uL — ABNORMAL LOW (ref 4.22–5.81)
WBC: 4.7 10*3/uL (ref 4.0–10.5)

## 2012-05-19 MED ORDER — POTASSIUM CHLORIDE CRYS ER 20 MEQ PO TBCR
40.0000 meq | EXTENDED_RELEASE_TABLET | Freq: Once | ORAL | Status: AC
  Administered 2012-05-19: 40 meq via ORAL
  Filled 2012-05-19: qty 2

## 2012-05-19 MED ORDER — METOCLOPRAMIDE HCL 10 MG PO TABS
10.0000 mg | ORAL_TABLET | Freq: Three times a day (TID) | ORAL | Status: DC
  Administered 2012-05-19 – 2012-05-20 (×4): 10 mg via ORAL
  Filled 2012-05-19 (×3): qty 1

## 2012-05-19 MED ORDER — DOCUSATE SODIUM 100 MG PO CAPS
100.0000 mg | ORAL_CAPSULE | Freq: Two times a day (BID) | ORAL | Status: DC
  Administered 2012-05-19 (×2): 100 mg via ORAL
  Filled 2012-05-19 (×2): qty 1

## 2012-05-19 NOTE — Discharge Summary (Signed)
Family Medicine Teaching [facility] Discharge Summary  Patient name: Adam Wagner Medical record number: [id number] Date of birth: [birth year] Age: 42 y.o. Gender: male Date of Admission: 05/09/2012  Date of Discharge: 05/20/2012 Admitting Physician: Sanjuana Letters, MD  Primary Care Provider: No primary provider on file.  Indication for Hospitalization: Hematuria, Urinary retention Discharge Diagnoses:  1. Bladder ruspture, S/p open cystorrhaphy and exploratory laparotomy  2. Hematuria 3. Oliguria 4. HTN 5. Post op ileus 6. AKI 7. Hypokalemia 8. Leukopenia  Consultations: Urology (Dr. Margarita Grizzle), General Surgery (Dr. Janee Morn)  Significant Labs and Imaging:   Recent Labs Lab 05/17/12 0644 05/18/12 0515 05/19/12 0515  HGB 10.4* 9.6* 9.6*  HCT 29.7* 27.6* 27.4*  WBC 3.3* 4.6 4.7  PLT 325 321 306    Recent Labs Lab 05/15/12 0540 05/16/12 0630 05/17/12 0644 05/18/12 0515 05/19/12 0515  NA 135 135 136 134* 134*  K 3.4* 3.3* 3.6 3.6 3.4*  CL 100 99 100 100 99  CO2 23 25 25 25 27   GLUCOSE 75 73 66* 73 82  BUN 15 12 12 11 8   CREATININE 0.89 0.92 0.86 0.95 0.95  CALCIUM 8.7 8.7 8.7 8.2* 8.2*  MG  --   --  2.1  --   --      Imaging/Diagnostic Tests:  DG Orthopantogram 05/09/2012  IMPRESSION:  The cracked tooth may involve the posterior left mandibular molar  as the crown appears irregular.   Complete Abd [place] 05/09/2012  IMPRESSION:  Grossly unremarkable kidneys.  Nonvisualization of aorta and pancreas.  Small to moderate amount ascites.  Question of hepatic nodularity was raised by the performing  technologist though this is not definitely confirmed on the  obtained images; if there is clinical concern for cirrhosis  consider CT imaging with contrast.  CT Abdomena/pelvis 05/10/2012 IMPRESSION:  Hemorrhage within and adjacent to the bladder. Scattered  nondependent foci of free abdominal gas.  While delayed imaging of the bladder does not demonstrate  active  extravasation of excretory contrast, these findings are still  highly suspicious for intraperitoneal bladder rupture.  Suspected secondary adynamic ileus.  DG Abd 1 view 05/10/2012 IMPRESSION:  Findings consistent with bladder perforation.  CT Pelvis - cystogram 05/12/2012 IMPRESSION:  No CT findings for persistent bladder leak.  DG Chest 2 view 05/13/2012 IMPRESSION:  Low lung volumes with crowding of bronchovascular markings at the  lung bases and no worrisome focal or acute abnormality suggested.  Mildly dilated small bowel loops with air fluid levels suggested in  the visualized portion of the abdomen. Full evaluation if indicated  clinically can be obtained with flat plate and upright views of the  Abdomen.  Orthopantogram 05/09/2012 IMPRESSION:  The cracked tooth may involve the posterior left mandibular molar  as the crown appears irregular.   Procedures: Cystorrhaphy and Exploratory Laparotamy 05/10/2012  Brief Hospital Course:  Adam Wagner is a 42 y.o. year old male with no reported past medical history presenting with with one day history of urinary retention and found to have grossly bloody urine on cath in ED, additionally with tooth pain and swelling. After work up he was found to have a ruptured bladder from an unknown cause and was surgically repaired  Bladder perforation  He presented to the ED with urinary retention, when a cath was placed he returned grossly bloody urine.urology was consulted and he was admitted. When his renal function improved we performed a CT with contrast that revealed a ruptured bladder. He was taken to the OR  On 2/4 for open cystorrhaphy and exploratory laparotomy. We treted him for suspicion of UTI for 6 days with IV rocephin. After surgery he had an ileus that persisted for about 7 days. We strated reglan, which was discontinued at discharge, and advanced his diet slowly when his bowel sounds and flatus returned. General surgery was also  consulted intra-operatively and helped manage the ileus. For pain he was scheduled on tramadol with PRN oxycodone which controlled his pain well. When the ileus persisted a repeat CT cystogram was performed which confirmed no leakage. A foley catheter was left in place to ensure that obstruction did not stress his bladder , he has urology OP f/u scheduled.   AKI  His creatinine was 3.0 on admission and quickly began to normalize with IVF after admission. A UA was suspicious for UTI and he was treated with rocephin as above. A catheter was left in place and he was started on flomax to insure that there was no chance of obstruction.   Dental pain He had weeks of tooth pain on presentation. A orthopantogram was ordered and IP dentist was consultred. He felt that due to the pts' severe trismus he needed an oral surgeon to treat the involved tooth. He felt that he had acute pulpitis and dental carie of tooth #17. We treated with rocephin as above and then switched him to Pennicillin V and he was advised to f/u with an oral surgeon on dc.   High blood pressure:  A new diagnosis. He was found to be persistently hypertensive and tachycardic despite good pain control. We started amlodipine and increased the dose to ten daily and added metoprolol 50 BID.   Leukopenia, Anemia  He had a dip in his WBC to 2.4 which normalized over the next two days. His ANC at it's lowest was 1.3k and it was only monitored. He had some expected post op anemia and his Hgb was stable at 9.6 discharge.   Dispo: Home  Discharge Medications:    Medication List    TAKE these medications       amLODipine 10 MG tablet  Commonly known as:  NORVASC  Take 1 tablet (10 mg total) by mouth daily.     ibuprofen 200 MG tablet  Commonly known as:  ADVIL,MOTRIN  Take 800 mg by mouth every 6 (six) hours as needed. For tooth pain     metoprolol 50 MG tablet  Commonly known as:  LOPRESSOR  Take 1 tablet (50 mg total) by mouth 2 (two)  times daily.     oxyCODONE 5 MG immediate release tablet  Commonly known as:  Oxy IR/ROXICODONE  Take 1 tablet (5 mg total) by mouth every 6 (six) hours as needed.     penicillin v potassium 500 MG tablet  Commonly known as:  VEETID  Take 1 tablet (500 mg total) by mouth every 6 (six) hours.     polyethylene glycol packet  Commonly known as:  MIRALAX / GLYCOLAX  Take 17 g by mouth daily.     polymixin-bacitracin 500-10000 UNIT/GM Oint ointment  Apply 1 application topically 2 (two) times daily.     Tamsulosin HCl 0.4 MG Caps  Commonly known as:  FLOMAX  Take 1 capsule (0.4 mg total) by mouth daily after breakfast.     traMADol 50 MG tablet  Commonly known as:  ULTRAM  Take 1 tablet (50 mg total) by mouth every 6 (six) hours.       Issues for Follow Up:  -  Foley catheter, has been in place since feb 3 when he presented to the ED.  - HTN- new diagnosis on this admission.   Outstanding Results: None  Discharge Instructions: Please refer to Patient Instructions section of EMR for full details.  Patient was counseled important signs and symptoms that should prompt return to medical care, changes in medications, dietary instructions, activity restrictions, and follow up appointments.   Follow-up Information   Follow up with Milford Cage, MD On 05/30/2012. (9:30 am)    Contact information:   [address] [place] FLOOR [address] [place] [address] [phone]       Call Kevin Fenton, MD. (for appt in 1-2 weeks)    Contact information:   [address] [place] [address] [phone]       Follow up with Oral surgeon. (call and make appointment in 1 week or less)       Discharge Condition: Stable  Kevin Fenton, MD 05/21/2012, 2:55 PM

## 2012-05-19 NOTE — Progress Notes (Signed)
Patient examined and I agree with the assessment and plan I discussed the plan of care with him and answered his questions. Violeta Gelinas, MD, MPH, FACS Pager: [phone]  05/19/2012 10:18 AM

## 2012-05-19 NOTE — Progress Notes (Signed)
FMTS Attending Daily Note: Liberato Stansbery MD [phone] pager office [phone] I  have seen and examined this patient, reviewed their chart. I have discussed this patient with the resident. I agree with the resident's findings, assessment and care plan. 

## 2012-05-19 NOTE — Progress Notes (Signed)
Patient entered into [facility] program .  [facility] outpatient pharmacy gave information . Spoke with patient on phone , explained  [facility] program to patient and instructed him to go to [facility] on day of discharge to get meds $3 / prescription co pay . Patient voiced understanding . Ronny Flurry RN BSN [phone]

## 2012-05-19 NOTE — Progress Notes (Signed)
. Family Medicine Teaching Service Daily Progress Note Service Page: [phone]  Subjective:  Feeling well today, + flatus, no BM yet. Denies nasuea and vomiting.  Objective: Temp:  [98.2 F (36.8 C)-98.8 F (37.1 C)] 98.2 F (36.8 C) (02/13 0629) Pulse Rate:  [76-98] 76 (02/13 0629) Resp:  [16-18] 16 (02/13 0629) BP: (133-142)/(81-95) 133/81 mmHg (02/13 0629) SpO2:  [94 %-95 %] 95 % (02/13 0629) Exam: Gen: NAD, alert, cooperative with exam HEENT: NCAT, EOMI, left lower jaw posterior tooth with apparent crack in it CV: RRR, good S1/S2, no murmur Resp: CTABL, no wheezes, non-labored Abd: mildly tender and distended, Clean dry intact wound on lower abdomen, bandages Clean dry and intact X 2 on RU and LU quads. + BS normoactive, L groin drain sight without erythema or induration expresses moderate serosanginous fluid with pressure.  GU: Scrotum grossly swollen Ext: No edema, warm Neuro: Alert and oriented, No gross deficits   I have reviewed the patient's medications, labs, imaging, and diagnostic testing.  Notable results are summarized below.  CBC BMET   Recent Labs Lab 05/17/12 0644 05/18/12 0515 05/19/12 0515  WBC 3.3* 4.6 4.7  HGB 10.4* 9.6* 9.6*  HCT 29.7* 27.6* 27.4*  PLT 325 321 306    Recent Labs Lab 05/17/12 0644 05/18/12 0515 05/19/12 0515  NA 136 134* 134*  K 3.6 3.6 3.4*  CL 100 100 99  CO2 25 25 27   BUN 12 11 8   CREATININE 0.86 0.95 0.95  GLUCOSE 66* 73 82  CALCIUM 8.7 8.2* 8.2*     Imaging/Diagnostic Tests: DG Orthopantogram 05/09/2012 IMPRESSION:  The cracked tooth may involve the posterior left mandibular molar  as the crown appears irregular.  Complete Abd [place] 05/09/2012 IMPRESSION:  Grossly unremarkable kidneys.  Nonvisualization of aorta and pancreas.  Small to moderate amount ascites.  Question of hepatic nodularity was raised by the performing  technologist though this is not definitely confirmed on the  obtained images; if there is  clinical concern for cirrhosis  consider CT imaging with contrast.   Plan: Adam Wagner is a 42 y.o. year old male with no reported past medical history presenting with with one day history of urinary retention and found to have grossly bloody urine on cath in ED, additionally with tooth pain and swelling.   Gross hematuria: Bladder perforation - New onset after cath in the ED, due to bladder perforation now POD 9 (2/4) from Open cystorrhaphy and exploratory laparotomy - urology consulting- appreciate their reccs  - Repeat cystogram without evidence of bladder leakage  - Post op ileus continues, discussed ambulation with patient   - Restarted rocephin-will continue through today, then back to penn v k for tooth  - Feel drainage not likely urine, most likely 3rd spacing that will improve with ileus - General surgery consulting- appreciate reccs  - Agree with reglan and NG tube, patient agrees to defer until he is nauseated and/or vomiting - Pain: using much less narcotics since addition of ultram, d/c Morphine 2 mgh IV spaced to q4 PRN for pain, continue 5 oxycodone Q4 prn, scheduled 50 ultram Q6 to try and wean narcotics   Post Op ileus - Improving- flatus increasing - Started reglan - Gen surg following- appreciate reccs - advance to regular diet, ambultae, will CT abd and pelvis if doesn't tolerate well per surgery reccs.    AKI - Cr of 3.00 on admission, now normalzing - Urology consulting, appreciate reccs  - repeat UA shows positive nitrites, large LE,  large Hgb, gram stain neg for bact, UCx pending - Rocephin - 6 days  Total given, now on Penn vk for oral coverage - on Flomax - Avoid NSAIDs and other nephrotoxic agents as possible - NS 150 mL/hr  Dental pain- acute pulpitis and dental carie of tooth #17 - IP dentist consulting- appreciate reccs  - Will need oral surgeon to remove tooth- OMFS not IP so will refer OP.  - cracked tooth, orthopantogram shows possible  involvement of posterior L molar - rocephin completed at 6 days total, added penn v k  - pain control per above  High blood pressure: with tachycardia- improved - BP 133/81, P76 - No Hx of HTN per pt - metoprolol 50 BID and amlodipine  Hypokalemia - K 3.4 today, replaced - Mag WNL  Leukopenia, Anemia -resolved, stable - WBC 2.4 X neuts of 55% = ANC of 1300 on 2/10, increased to 4.6 today - Hemoglobin stable at 9.6 overnight - will monitor  FEN/GI: clears, will advance carefully as tolerated,  NS @ 150 mL/hr  Prophylaxis: sub q heparin Disposition: home today  Code Status: full   Kevin Fenton, MD 05/19/2012, 7:09 AM

## 2012-05-19 NOTE — Progress Notes (Signed)
Patient ID: Adam Wagner, male   DOB: [birth year], 42 y.o.   MRN: [id number]   LOS: 10 days   Subjective: Denies N/V. Significant flatus, no BM.  Objective: Vital signs in last 24 hours: Temp:  [98.2 F (36.8 C)-98.8 F (37.1 C)] 98.2 F (36.8 C) (02/13 0629) Pulse Rate:  [76-98] 76 (02/13 0629) Resp:  [16-18] 16 (02/13 0629) BP: (133-142)/(81-95) 133/81 mmHg (02/13 0629) SpO2:  [94 %-95 %] 95 % (02/13 0629) Last BM Date: 05/18/12   Lab Results:  CBC  Recent Labs  05/18/12 0515 05/19/12 0515  WBC 4.6 4.7  HGB 9.6* 9.6*  HCT 27.6* 27.4*  PLT 321 306   BMET  Recent Labs  05/18/12 0515 05/19/12 0515  NA 134* 134*  K 3.6 3.4*  CL 100 99  CO2 25 27  GLUCOSE 73 82  BUN 11 8  CREATININE 0.95 0.95  CALCIUM 8.2* 8.2*    General appearance: alert and no distress Resp: clear to auscultation bilaterally Cardio: regular rate and rhythm GI: normal findings: bowel sounds normal, soft, appropriately tender and incision C/D/I   Assessment/Plan: Intraperitoneal bladder rupture Ileus -- Resolved. Will add colace. Wean prokinetic but could stop on discharge. Would recommend d/c IVF. Ok to d/c from general surgical standpoint.    Freeman Caldron, PA-C Pager: [phone] General Trauma PA Pager: [phone]   05/19/2012

## 2012-05-20 MED ORDER — AMLODIPINE BESYLATE 10 MG PO TABS: 10.0000 mg | ORAL_TABLET | Freq: Every day | ORAL | Status: DC

## 2012-05-20 MED ORDER — OXYCODONE HCL 5 MG PO TABS: 5.0000 mg | ORAL_TABLET | Freq: Four times a day (QID) | ORAL | Status: DC | PRN

## 2012-05-20 MED ORDER — TAMSULOSIN HCL 0.4 MG PO CAPS: 0.4000 mg | ORAL_CAPSULE | Freq: Every day | ORAL | Status: DC

## 2012-05-20 MED ORDER — POLYETHYLENE GLYCOL 3350 17 G PO PACK: 17.0000 g | PACK | Freq: Every day | ORAL | Status: DC

## 2012-05-20 MED ORDER — DOUBLE ANTIBIOTIC 500-10000 UNIT/GM EX OINT: 1.0000 "application " | TOPICAL_OINTMENT | Freq: Two times a day (BID) | CUTANEOUS | Status: DC

## 2012-05-20 MED ORDER — PENICILLIN V POTASSIUM 500 MG PO TABS: 500.0000 mg | ORAL_TABLET | Freq: Four times a day (QID) | ORAL | Status: DC

## 2012-05-20 MED ORDER — TRAMADOL HCL 50 MG PO TABS: 50.0000 mg | ORAL_TABLET | Freq: Four times a day (QID) | ORAL | Status: DC

## 2012-05-20 MED ORDER — METOPROLOL TARTRATE 50 MG PO TABS: 50.0000 mg | ORAL_TABLET | Freq: Two times a day (BID) | ORAL | Status: DC

## 2012-05-20 NOTE — Progress Notes (Signed)
. Family Medicine Teaching Service Daily Progress Note Service Page: [phone]  Subjective:  Feeling well today, + flatus, no BM yet. Denies nasuea and vomiting. Worried about how he will get home, His parents can come get him tomorrow as they are out of town, there is a friend he can likely stay with in town.   Objective: Temp:  [97.7 F (36.5 C)-98.4 F (36.9 C)] 98.4 F (36.9 C) (02/14 0600) Pulse Rate:  [74-86] 74 (02/14 0600) Resp:  [18-20] 20 (02/14 0600) BP: (129-145)/(84-96) 145/96 mmHg (02/14 0600) SpO2:  [96 %-98 %] 96 % (02/14 0600) Exam: Gen: NAD, alert, cooperative with exam HEENT: NCAT, EOMI, left lower jaw posterior tooth with apparent crack in it CV: RRR, good S1/S2, no murmur Resp: CTABL, no wheezes, non-labored Abd: mildly tender and distended, Clean dry intact wound on lower abdomen, bandages Clean dry and intact X 2 on RU and LU quads. + BS normoactive, L groin drain sight without erythema or induration expresses moderate serosanginous fluid with pressure.  GU: Scrotum grossly swollen Ext: No edema, warm Neuro: Alert and oriented, No gross deficits   I have reviewed the patient's medications, labs, imaging, and diagnostic testing.  Notable results are summarized below.  CBC BMET   Recent Labs Lab 05/17/12 0644 05/18/12 0515 05/19/12 0515  WBC 3.3* 4.6 4.7  HGB 10.4* 9.6* 9.6*  HCT 29.7* 27.6* 27.4*  PLT 325 321 306    Recent Labs Lab 05/17/12 0644 05/18/12 0515 05/19/12 0515  NA 136 134* 134*  K 3.6 3.6 3.4*  CL 100 100 99  CO2 25 25 27   BUN 12 11 8   CREATININE 0.86 0.95 0.95  GLUCOSE 66* 73 82  CALCIUM 8.7 8.2* 8.2*     Imaging/Diagnostic Tests: DG Orthopantogram 05/09/2012 IMPRESSION:  The cracked tooth may involve the posterior left mandibular molar  as the crown appears irregular.  Complete Abd [place] 05/09/2012 IMPRESSION:  Grossly unremarkable kidneys.  Nonvisualization of aorta and pancreas.  Small to moderate amount ascites.   Question of hepatic nodularity was raised by the performing  technologist though this is not definitely confirmed on the  obtained images; if there is clinical concern for cirrhosis  consider CT imaging with contrast.   Plan: Adam Wagner is a 42 y.o. year old male with no reported past medical history presenting with with one day history of urinary retention and found to have grossly bloody urine on cath in ED, additionally with tooth pain and swelling.   Gross hematuria: Bladder perforation - New onset after cath in the ED, due to bladder perforation now POD 9 (2/4) from Open cystorrhaphy and exploratory laparotomy - urology consulting- appreciate their reccs  - Repeat cystogram without evidence of bladder leakage  - Post op ileus continues, discussed ambulation with patient   - Restarted rocephin-will continue through today, then back to penn v k for tooth  - Feel drainage not likely urine, most likely 3rd spacing that will improve with ileus - General surgery consulting- appreciate reccs  - Agree with reglan and NG tube, patient agrees to defer until he is nauseated and/or vomiting - Pain: using much less narcotics since addition of ultram, d/c Morphine 2 mgh IV spaced to q4 PRN for pain, continue 5 oxycodone Q4 prn, scheduled 50 ultram Q6    Post Op ileus - resolved - flatus increasing - Started reglan - Gen surg following- appreciate reccs - regular diet, ambultae - DC home    AKI - Cr of 3.00  on admission, now normalzing - Urology consulting, appreciate reccs  - repeat UA shows positive nitrites, large LE, large Hgb, gram stain neg for bact, UCx pending - Rocephin - 6 days  Total given, now on Penn vk for oral coverage - on Flomax - Avoid NSAIDs and other nephrotoxic agents as possible - DC fluids  Dental pain- acute pulpitis and dental carie of tooth #17 - IP dentist consulting- appreciate reccs  - Will need oral surgeon to remove tooth- OMFS not IP so will refer OP.   - cracked tooth, orthopantogram shows possible involvement of posterior L molar - rocephin completed at 6 days total, added penn v k  - pain control per above  High blood pressure: with tachycardia- improved - BP 145/96, P74 - No Hx of HTN per pt - metoprolol 50 BID and amlodipine  Hypokalemia - K 3.4 today, replaced - Mag WNL  Leukopenia, Anemia -resolved, stable - WBC 2.4 X neuts of 55% = ANC of 1300 on 2/10, increased to 4.6 today - Hemoglobin stable  - will monitor  FEN/GI: clears, will advance carefully as tolerated,  NS @ 150 mL/hr  Prophylaxis: sub q heparin Disposition: home today  Code Status: full   Kevin Fenton, MD 05/20/2012, 9:19 AM

## 2012-05-20 NOTE — Progress Notes (Signed)
FMTS Attending Daily Note: Bao Coreas MD [phone] pager office [phone] I have discussed this patient with the resident and reviewed the assessment and plan as documented above. I agree wit the resident's findings and plan.  

## 2012-05-20 NOTE — Progress Notes (Signed)
Clinical Social Work  CSW received call from CM that patient needed assistance with transportation. CSW met with patient at bedside who reports a friend was supposed to pick him up but now he cannot get in touch with friend. Patient reports he is aware on how to take the bus. CSW called [employer] who confirmed busses are running today on snow schedule. CSW provided patient with bus pass and explained which routes to take to get home. Patient appreciative of help and reports no further needs. CSW is signing off but available if further needs arise.  Unk Lightning, LCSW  (Coverage for [employer])

## 2012-05-21 NOTE — Discharge Summary (Signed)
Family Medicine Teaching Service  Discharge Note : Attending Sara Neal MD Pager [phone] Office [phone] I have seen and examined this patient, reviewed their chart and discussed discharge planning wit the resident at the time of discharge. I agree with the discharge plan as above.  

## 2012-07-01 ENCOUNTER — Ambulatory Visit (INDEPENDENT_AMBULATORY_CARE_PROVIDER_SITE_OTHER): Payer: Self-pay | Admitting: Family Medicine

## 2012-07-01 ENCOUNTER — Encounter: Payer: Self-pay | Admitting: Family Medicine

## 2012-07-01 VITALS — BP 132/83 | HR 63 | Temp 98.4°F | Ht 69.0 in | Wt 202.8 lb

## 2012-07-01 DIAGNOSIS — K089 Disorder of teeth and supporting structures, unspecified: Secondary | ICD-10-CM

## 2012-07-01 DIAGNOSIS — K567 Ileus, unspecified: Secondary | ICD-10-CM

## 2012-07-01 DIAGNOSIS — K560 Paralytic ileus: Secondary | ICD-10-CM

## 2012-07-01 DIAGNOSIS — I10 Essential (primary) hypertension: Secondary | ICD-10-CM

## 2012-07-01 DIAGNOSIS — R319 Hematuria, unspecified: Secondary | ICD-10-CM

## 2012-07-01 DIAGNOSIS — K0889 Other specified disorders of teeth and supporting structures: Secondary | ICD-10-CM

## 2012-07-01 DIAGNOSIS — N3289 Other specified disorders of bladder: Secondary | ICD-10-CM

## 2012-07-01 DIAGNOSIS — K9189 Other postprocedural complications and disorders of digestive system: Secondary | ICD-10-CM

## 2012-07-01 LAB — POCT URINALYSIS DIPSTICK
Bilirubin, UA: NEGATIVE
Blood, UA: NEGATIVE
Nitrite, UA: NEGATIVE
Spec Grav, UA: >=1.03
Urobilinogen, UA: 2
pH, UA: 6

## 2012-07-01 LAB — CBC WITH DIFFERENTIAL/PLATELET
Basophils Relative: 1 % (ref 0–1)
Eosinophils Absolute: 0.5 10*3/uL (ref 0.0–0.7)
Eosinophils Relative: 11 % — ABNORMAL HIGH (ref 0–5)
Hemoglobin: 12.4 g/dL — ABNORMAL LOW (ref 13.0–17.0)
MCH: 27.9 pg (ref 26.0–34.0)
MCHC: 34.5 g/dL (ref 30.0–36.0)
MCV: 80.9 fL (ref 78.0–100.0)
Monocytes Relative: 7 % (ref 3–12)
Neutrophils Relative %: 45 % (ref 43–77)

## 2012-07-01 LAB — BASIC METABOLIC PANEL
BUN: 10 mg/dL (ref 6–23)
Calcium: 9.6 mg/dL (ref 8.4–10.5)
Creat: 1.15 mg/dL (ref 0.50–1.35)
Glucose, Bld: 94 mg/dL (ref 70–99)
Sodium: 136 mEq/L (ref 135–145)

## 2012-07-01 MED ORDER — TRAMADOL HCL 50 MG PO TABS: 50.0000 mg | ORAL_TABLET | Freq: Four times a day (QID) | ORAL | Status: DC | PRN

## 2012-07-01 MED ORDER — IBUPROFEN 200 MG PO TABS: 600.0000 mg | ORAL_TABLET | Freq: Four times a day (QID) | ORAL | Status: DC | PRN

## 2012-07-01 MED ORDER — AMLODIPINE BESYLATE 10 MG PO TABS: 10.0000 mg | ORAL_TABLET | Freq: Every day | ORAL | Status: DC

## 2012-07-01 NOTE — Progress Notes (Signed)
  Subjective:    Patient ID: Adam Wagner, male    DOB: Mar 23, 1971, 42 y.o.   MRN: [id number]  HPI Pt here for hospital follow up  Bladder rupture: seen urology with next appt on April 11th, no hematuria, urinating normally. Catheter out for 3 weeks, had lots of dysuria after removal, some less than 2 days ago with different odor  Ileus: tolerating PO well, bowels moving normally, not needing miralax any more.   Tooth pain, had it extracted, still taking penicillin, no fevers, chills, sweats.   Some pain with erection, not sexually active yet No scrotal swelling  HTN: no chest pain, headche, palpitations, taking meds  Review of Systems Per HPI    Objective:   Physical Exam  Gen: NAD, alert, cooperative with exam CV: RRR, good S1/S2, no murmur Resp: CTABL, no wheezes, non-labored Abd: Soft, well healed scar on lower abdomen midline with 2 steri strips still in place.  Ext: No edema     Assessment & Plan:

## 2012-07-01 NOTE — Assessment & Plan Note (Addendum)
Well controlled today,  Compliant with meds Continue current meds and f/u in 6 months.

## 2012-07-01 NOTE — Patient Instructions (Signed)
Thanks for coming in today  I think we should follow up in 6 months about your blood pressure, if you have any problems sooner then call and I can see you sooner.

## 2012-07-01 NOTE — Assessment & Plan Note (Signed)
S/p open cystorrhaphy and exploratory laparotomy, on May 10, 2012 Following with urology, Foley catheter out now for 3 weeks.  No hematuria, good UOP Was haviung some residual dysuria after the catheter was removed, UA done and not indicative of infection Not sexually active yet, asked him to ask urology for when to restart, next appt on April 11 Tramadol Rx given for pain, scar healing well.

## 2012-07-01 NOTE — Assessment & Plan Note (Signed)
Due to dental infection/acute pulpitis, now resolved since extraction about 2 weeks ago Was treating with Penn V K--> DC use today

## 2012-07-01 NOTE — Assessment & Plan Note (Signed)
Resolved before dc, no recurrence, good BMs and now not needing miralax

## 2012-07-01 NOTE — Assessment & Plan Note (Signed)
Resolved, 2/2 to ruptured bladdder which is now repaired

## 2012-07-04 ENCOUNTER — Encounter: Payer: Self-pay | Admitting: Family Medicine

## 2012-07-15 ENCOUNTER — Telehealth: Payer: Self-pay | Admitting: Family Medicine

## 2012-07-15 NOTE — Telephone Encounter (Signed)
Patient would like to speak to Dr. Ermalinda Memos about some medications they had discussed briefly before.

## 2012-07-15 NOTE — Telephone Encounter (Signed)
Patient calling to request viagra before going on vacation this weekend. States that he went to his urology appt today and they said that it was up to me.  I explained that at this point, having had a recent urologic surgery, I was uncomfortable prescribing it.   On our last visit he was having slightly painful erections and has had good urology follow up to this point.   Kevin Fenton, MD [phone], 410 pm

## 2012-07-15 NOTE — Telephone Encounter (Signed)
Called pt. 'mail box is full' unable to leave message. Waiting for call back. Please ask pt, WHICH medications. Thanks. Lorenda Hatchet, Renato Battles

## 2012-07-15 NOTE — Telephone Encounter (Signed)
Fwd. To Dr.Bradshaw.

## 2012-07-15 NOTE — Telephone Encounter (Signed)
What the patient wants to discuss with Dr. Ermalinda Memos is personal.  It is about Viagra.

## 2012-08-17 ENCOUNTER — Ambulatory Visit: Payer: Self-pay | Admitting: Family Medicine

## 2012-08-18 ENCOUNTER — Encounter: Payer: Self-pay | Admitting: Family Medicine

## 2012-08-18 NOTE — Telephone Encounter (Signed)
error 

## 2012-08-23 ENCOUNTER — Ambulatory Visit (INDEPENDENT_AMBULATORY_CARE_PROVIDER_SITE_OTHER): Payer: Self-pay | Admitting: Family Medicine

## 2012-08-23 VITALS — BP 140/90 | HR 60 | Ht 69.0 in | Wt 220.0 lb

## 2012-08-23 DIAGNOSIS — N3289 Other specified disorders of bladder: Secondary | ICD-10-CM

## 2012-08-23 DIAGNOSIS — R338 Other retention of urine: Secondary | ICD-10-CM

## 2012-08-23 DIAGNOSIS — N529 Male erectile dysfunction, unspecified: Secondary | ICD-10-CM | POA: Insufficient documentation

## 2012-08-23 DIAGNOSIS — I10 Essential (primary) hypertension: Secondary | ICD-10-CM

## 2012-08-23 LAB — POCT URINALYSIS DIPSTICK
Bilirubin, UA: NEGATIVE
Blood, UA: NEGATIVE
Leukocytes, UA: NEGATIVE
Nitrite, UA: NEGATIVE
Protein, UA: NEGATIVE
Urobilinogen, UA: 1
pH, UA: 6

## 2012-08-23 LAB — BASIC METABOLIC PANEL
BUN: 16 mg/dL (ref 6–23)
Calcium: 10.1 mg/dL (ref 8.4–10.5)
Creat: 1.28 mg/dL (ref 0.50–1.35)

## 2012-08-23 MED ORDER — TRAMADOL HCL 50 MG PO TABS: 100.0000 mg | ORAL_TABLET | Freq: Three times a day (TID) | ORAL | Status: DC | PRN

## 2012-08-23 MED ORDER — HYDROCHLOROTHIAZIDE 25 MG PO TABS: 12.5000 mg | ORAL_TABLET | Freq: Every day | ORAL | Status: DC

## 2012-08-23 MED ORDER — SILDENAFIL CITRATE 100 MG PO TABS: 50.0000 mg | ORAL_TABLET | Freq: Every day | ORAL | Status: DC | PRN

## 2012-08-23 NOTE — Assessment & Plan Note (Signed)
Still some post op pain but is stable and managed by tramadol  Refilled tramadol at 100 mg Q8 PRN, requested dose increase Encouraged slowly starting to exercise as we are 12+ weeks out UA without signs of infection or blood, Spec grav >1.030- encouraged hydration

## 2012-08-23 NOTE — Assessment & Plan Note (Signed)
Likely due to blood pressure medication, requesting viagra Dc'd metoprolol and started HCTZ at minimum dose Discussed controlling HTN with lifestyle modifications Rx for vigra given, will follow

## 2012-08-23 NOTE — Progress Notes (Addendum)
  Subjective:    Patient ID: Adam Wagner, male    DOB: 06-Mar-1971, 42 y.o.   MRN: [id number]  HPI Pt here for follow up of bladder rupture, HTN, New ED  Bladder rupture, No hematuria, some post op pain continued but is styable and managed with meds which he does not require daily.  Did recently take some oxycodone Tramadol helps, would like higher dose He his hesitant to lift weights as coughing, lifting and bending still hurts the surgical site Following with urology and will get appt in 1 month  HTN Checks daily, usually 130s/80s, compliant with meds but out of metoprolol No chest pain, dyspnea, headaches Not miodified diet as of yet Walking frequently  ED Since dc from hospital,  Hard time getting and maintaining erection, also doesn't feel like it's as hard as it used to be Hs been sexually active since surgery Wants to try viagra.    Review of Systems Per HPI    Objective:   Physical Exam  Gen: NAD, alert, cooperative with exam HEENT: NCAT, EOMI, PERRL CV: RRR, good S1/S2, no murmur Resp: CTABL, no wheezes, non-labored Abd: lower abd with well healed surgical scar without erythema or tendernes sto palpation. Pt states there is some decreased sensation above base of the penis        Assessment & Plan:

## 2012-08-23 NOTE — Assessment & Plan Note (Addendum)
Controlled well on amlodipine and metoprolol Will dc metoprolol and start low dose Hctz in its place- 12.5 qd Discussed low salt diet and exercise and that he does likely have true HTN as his BP is elevated today without one of his meds Check BMP with previous AKI and small bump in cre on last check  F/u 3 months.

## 2012-08-23 NOTE — Patient Instructions (Signed)
Thanks for coming in today, it was good to see you!  Lets stop the metoprolol and start Hydrochlorothiazide.  Lets follow up in 3 months

## 2012-08-24 ENCOUNTER — Telehealth: Payer: Self-pay | Admitting: Family Medicine

## 2012-08-24 NOTE — Telephone Encounter (Signed)
Called to discuss worsening kidney function.  His Cre is trending up with baseline around 0.9 and a reading of 1.28 on 5/20.  His UA did not indicate infection n 5/20, he has avoided NSAIDs to my knowledge, and although urinary retention is on his problem list (this is a hold over from his hospitalization) my understanding was that he was urinating normally per his description of giving th eurione sample - however we did not address this directly.   Given this is a trend of increase and spec grav indicated dehydration at our visit yesterday aI will plan to encourage aggressive orpal rehydration and re-check BMP in 1 week. I will however have to get him on the phone to arrange this. I will attempt an additional call tomorrow. I did leave a message.  Further I will consider prostate exam and renal US if kit does not improve with rehydration and he is having obstructive symptoms not mentioned on our visit.   Kevin Fenton, MD 08/24/2012, 449 pm

## 2012-08-26 ENCOUNTER — Telehealth: Payer: Self-pay | Admitting: Family Medicine

## 2012-08-26 NOTE — Telephone Encounter (Signed)
Called to discuss labs, no answer, left message to call back.   Kevin Fenton, MD 08/26/2012 1402 pm

## 2012-11-17 ENCOUNTER — Other Ambulatory Visit: Payer: Self-pay | Admitting: Family Medicine

## 2012-12-02 ENCOUNTER — Telehealth: Payer: Self-pay | Admitting: Family Medicine

## 2012-12-02 NOTE — Telephone Encounter (Signed)
Pt wanting to take a medication OTC that his friend takes, pt doesn't know the name of the medicine, to increase his energy level.  Instructed pt to have OV with MD to discuss any OTC meds before taking them.  Michille Mcelrath, Darlyne Russian, CMA

## 2012-12-02 NOTE — Telephone Encounter (Signed)
Patient has questions about his medications and possible adding something to help him with his energy level.  This is because he is trying to lose some weight.

## 2013-05-04 ENCOUNTER — Telehealth: Payer: Self-pay | Admitting: Family Medicine

## 2013-05-04 DIAGNOSIS — I10 Essential (primary) hypertension: Secondary | ICD-10-CM

## 2013-05-04 MED ORDER — HYDROCHLOROTHIAZIDE 25 MG PO TABS: 12.5000 mg | ORAL_TABLET | Freq: Every day | ORAL | Status: DC

## 2013-05-04 NOTE — Telephone Encounter (Signed)
Needs refill on hydrochlorothiazide Would like it called into to  [facility] in [place] He thinks it is on [address]

## 2013-05-04 NOTE — Telephone Encounter (Signed)
Pt notified.  [name], CMA  

## 2013-05-04 NOTE — Telephone Encounter (Signed)
Will fwd to MD.  Leona Pressly L, CMA  

## 2013-05-04 NOTE — Addendum Note (Signed)
Addended by: Timmothy Euler on: 05/04/2013 12:16 PM   Modules accepted: Orders

## 2013-07-25 ENCOUNTER — Other Ambulatory Visit: Payer: Self-pay | Admitting: Family Medicine

## 2013-08-16 IMAGING — CT CT ABD-PEL WO/W CM
4 of 9 series · 12 of 32 positions shown, 17 images · IV contrast (omnipaque)
Comparison: Ultrasound abdomen dated 05/09/2012

CLINICAL DATA: Gross hematuria, dysuria, abdominal bloating

CT ABDOMEN AND PELVIS WITHOUT AND WITH CONTRAST
TECHNIQUE: Multidetector CT imaging of the abdomen and pelvis was
performed without contrast material in one or both body regions,
followed by contrast material(s) and further sections in one or
both body regions.
Contrast: 80mL OMNIPAQUE IOHEXOL 300 MG/ML  SOLN

[Series 2: renal stone · axial · 0.91mm/px · z∈[-335,-155]mm · 2 of 108 slices shown]
[im 36/108  soft-tissue]
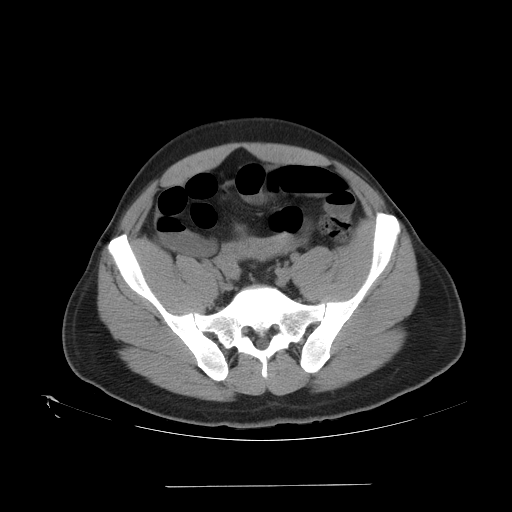
[im 72/108  soft-tissue]
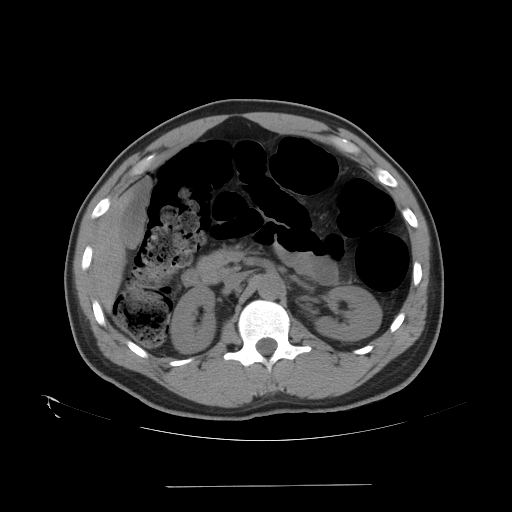

[Series 5: abd/pelvis with cm · axial · 0.87mm/px · z∈[-330,-150]mm · 2 of 108 slices shown (1 of 2)]
[im 36/108  soft-tissue]
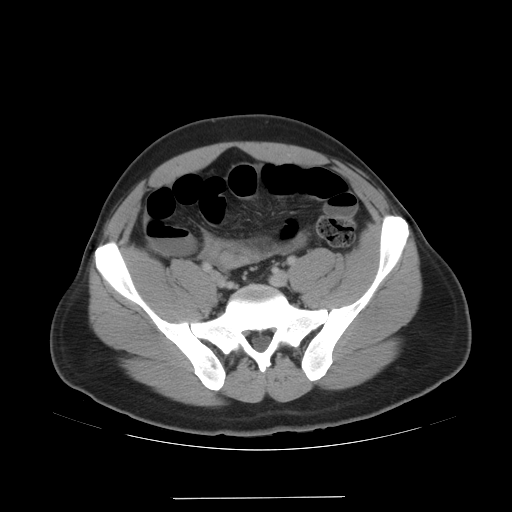
[im 72/108  soft-tissue]
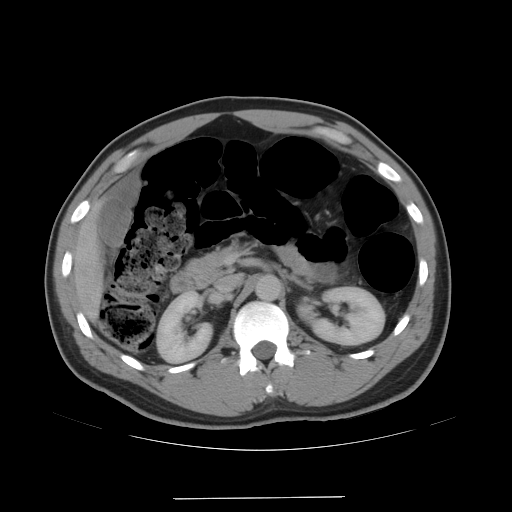

[Series 105: abd/pelvis with cm · axial · 0.87mm/px · z∈[-432,-48]mm · 6 of 216 slices shown, 11 images (2 of 2)]
[im 31/216  soft-tissue]
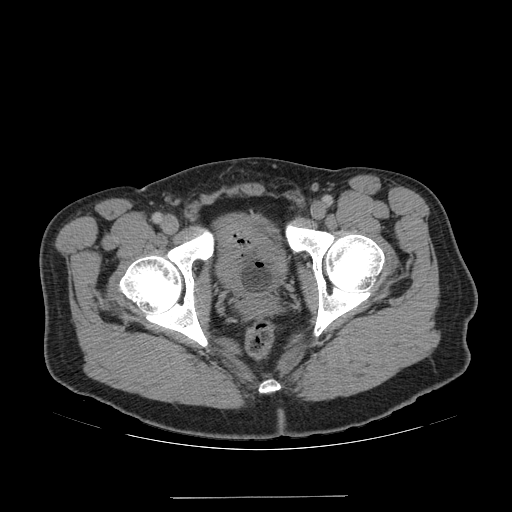
[im 31/216  bone]
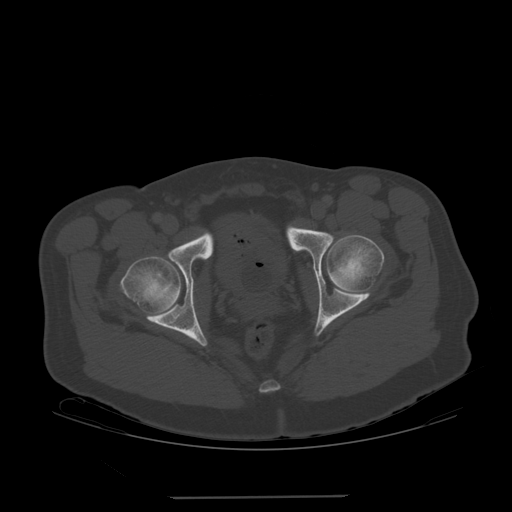
[im 62/216  soft-tissue]
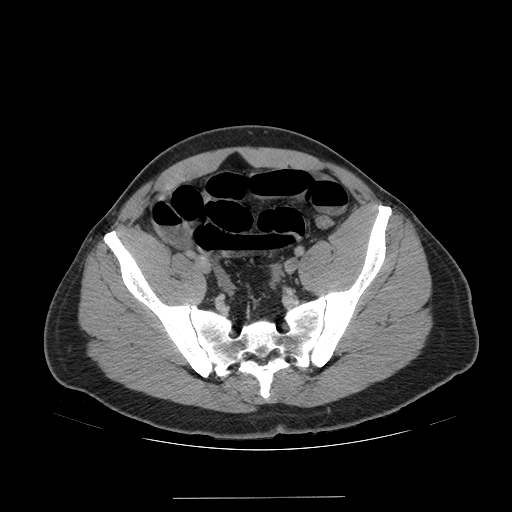
[im 93/216  soft-tissue]
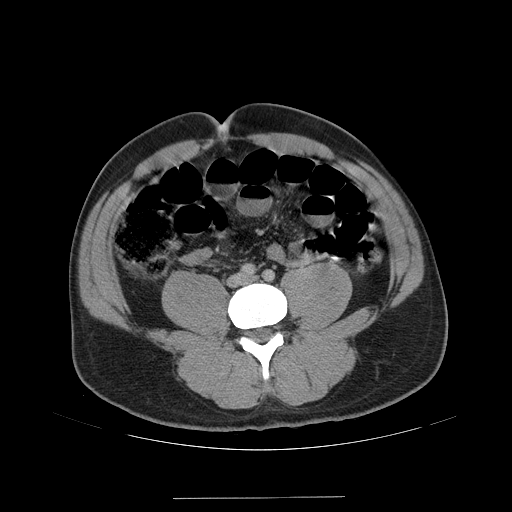
[im 93/216  lung]
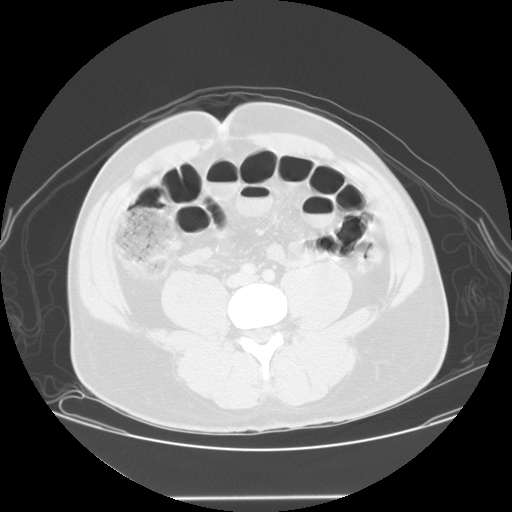
[im 123/216  soft-tissue]
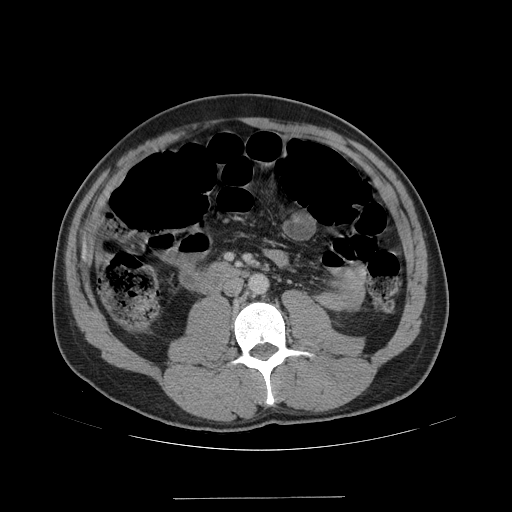
[im 123/216  lung]
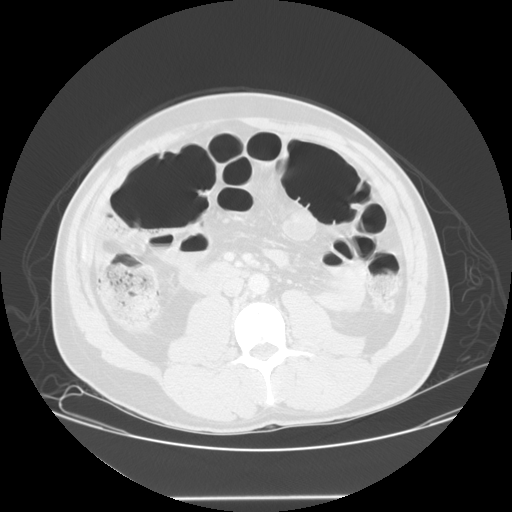
[im 154/216  soft-tissue]
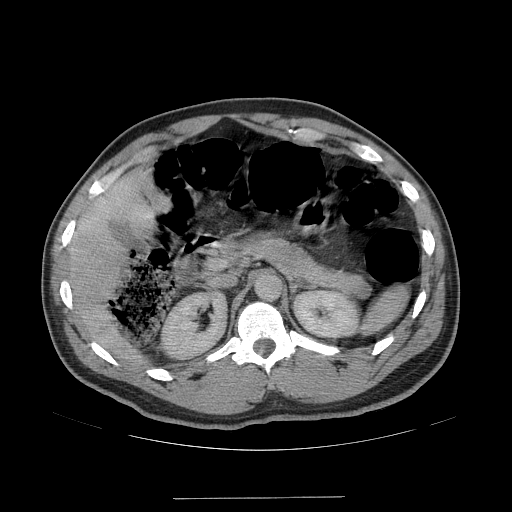
[im 154/216  lung]
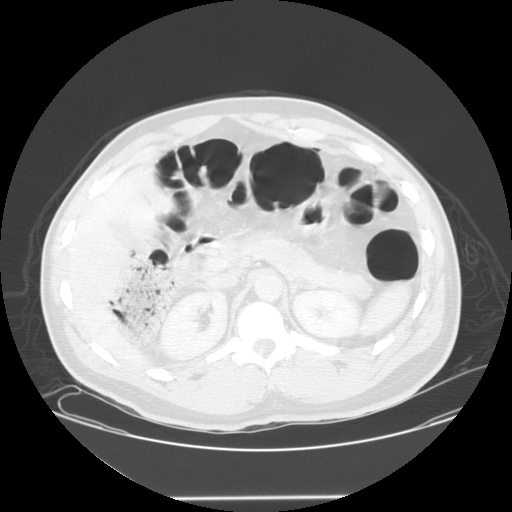
[im 185/216  soft-tissue]
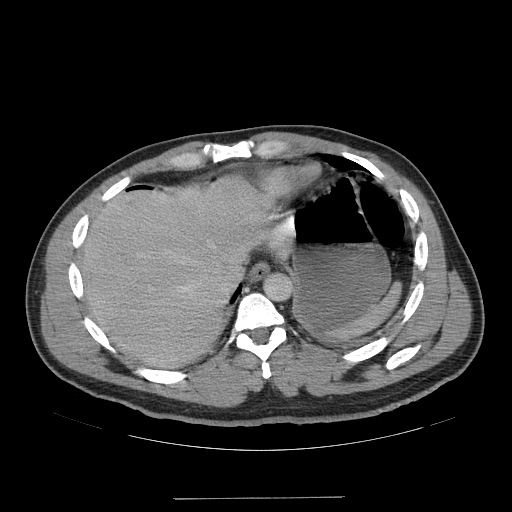
[im 185/216  lung]
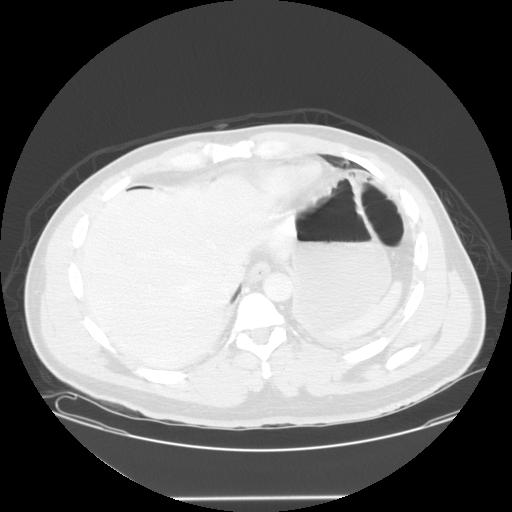

[Series 106: sagittal · sagittal · 1.04mm/px · 2 of 116 slices shown]
[im 39/116  soft-tissue]
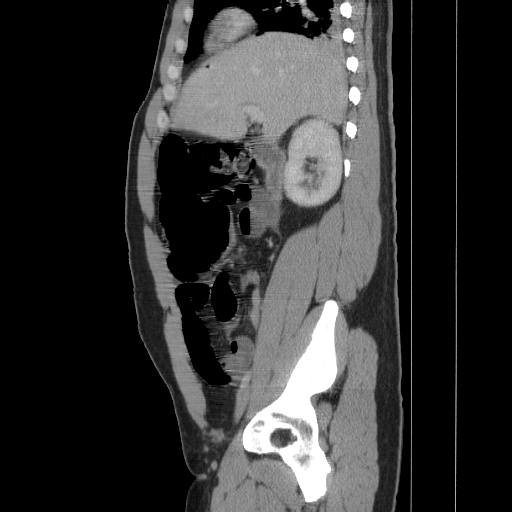
[im 77/116  soft-tissue]
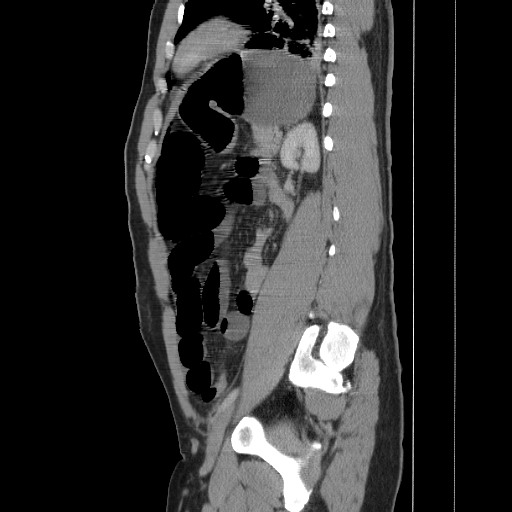

[12 of 32 positions shown; findings below may reference images not displayed]

FINDINGS: Small right and trace left pleural effusions.  Associated
lower lobe opacities, likely atelectasis.

Scattered foci of free abdominal gas below the diaphragm (for
example, series 2/images 19 and 29).

Liver, spleen, pancreas, and adrenal glands are within normal
limits.

Gallbladder is remarkable, noting pericholecystic fluid/ascites.
No intrahepatic or extrahepatic ductal dilatation.

Kidneys are within normal limits.  No renal, ureteral, or bladder
calculi.  No hydronephrosis.

Generalized prominence of small and large bowel, suggesting
adynamic ileus.  Normal appendix.

No evidence of abdominal aortic aneurysm.

Prostate is unremarkable.

Small volume complex abdominopelvic ascites/hemorrhage.  5.8 x
x 3.9 cm hematoma superior/posterior to the bladder (series 2/image
83).

Foley catheter with hemorrhage and gas within the bladder.  These
findings are worrisome for intraperitoneal bladder rupture.

Delayed imaging through the bladder does not demonstrate active
extravasation of excretory contrast.

Mild degenerative changes of the visualized thoracolumbar spine.
IMPRESSION: Hemorrhage within and adjacent to the bladder.  Scattered
nondependent foci of free abdominal gas.

While delayed imaging of the bladder does not demonstrate active
extravasation of excretory contrast, these findings are still
highly suspicious for intraperitoneal bladder rupture.

Suspected secondary adynamic ileus.

These results were called by telephone on 05/10/2012 at 3636 hours
to Dr Houle, who verbally acknowledged these results.

## 2013-08-19 IMAGING — CR DG CHEST 2V
2 series · 2 of 2 positions shown · non-contrast
Comparison: None.

CLINICAL DATA: Hypoxia.  History of exploratory laparotomy 3 days
ago

CHEST - 2 VIEW

[w chest lat]
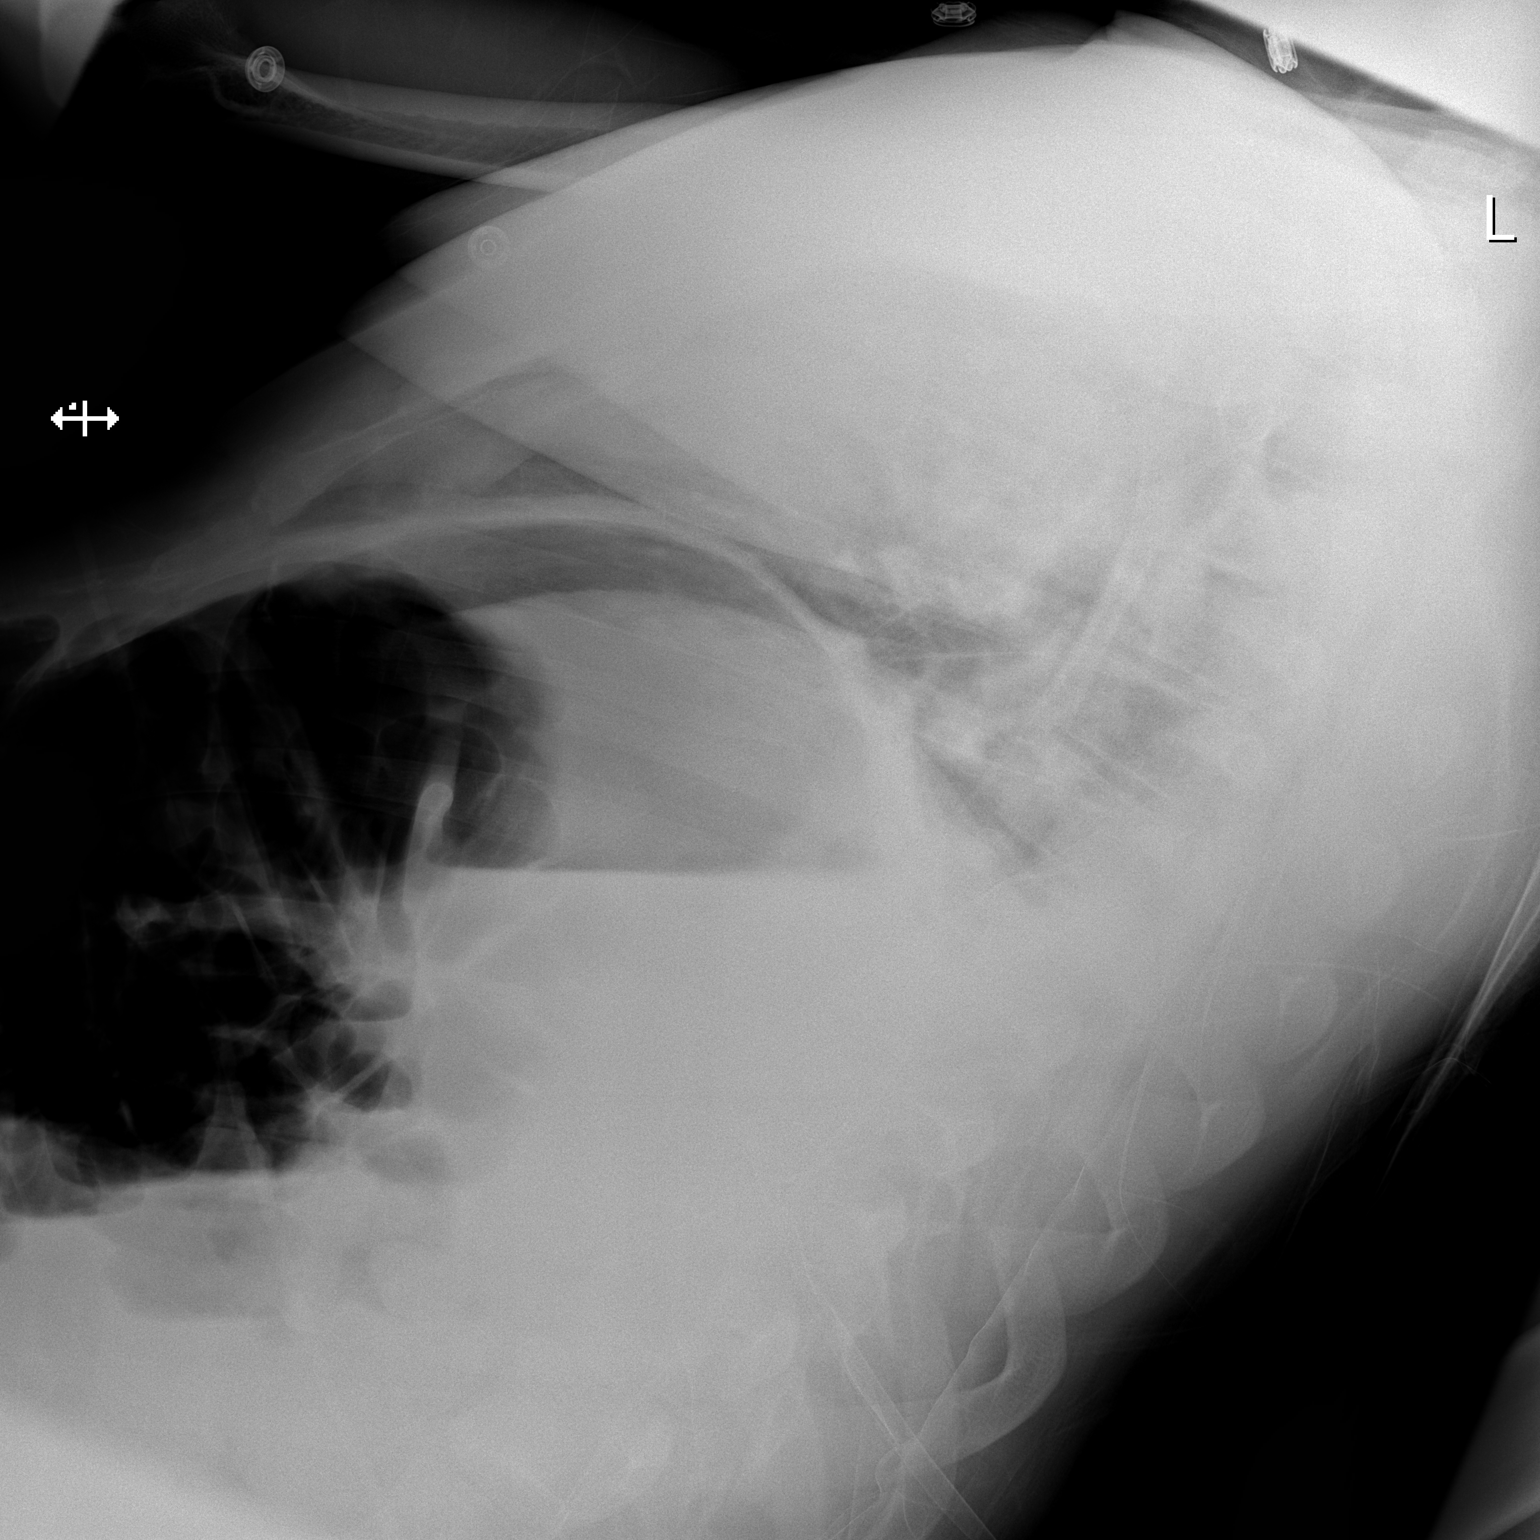

[x chest ap]
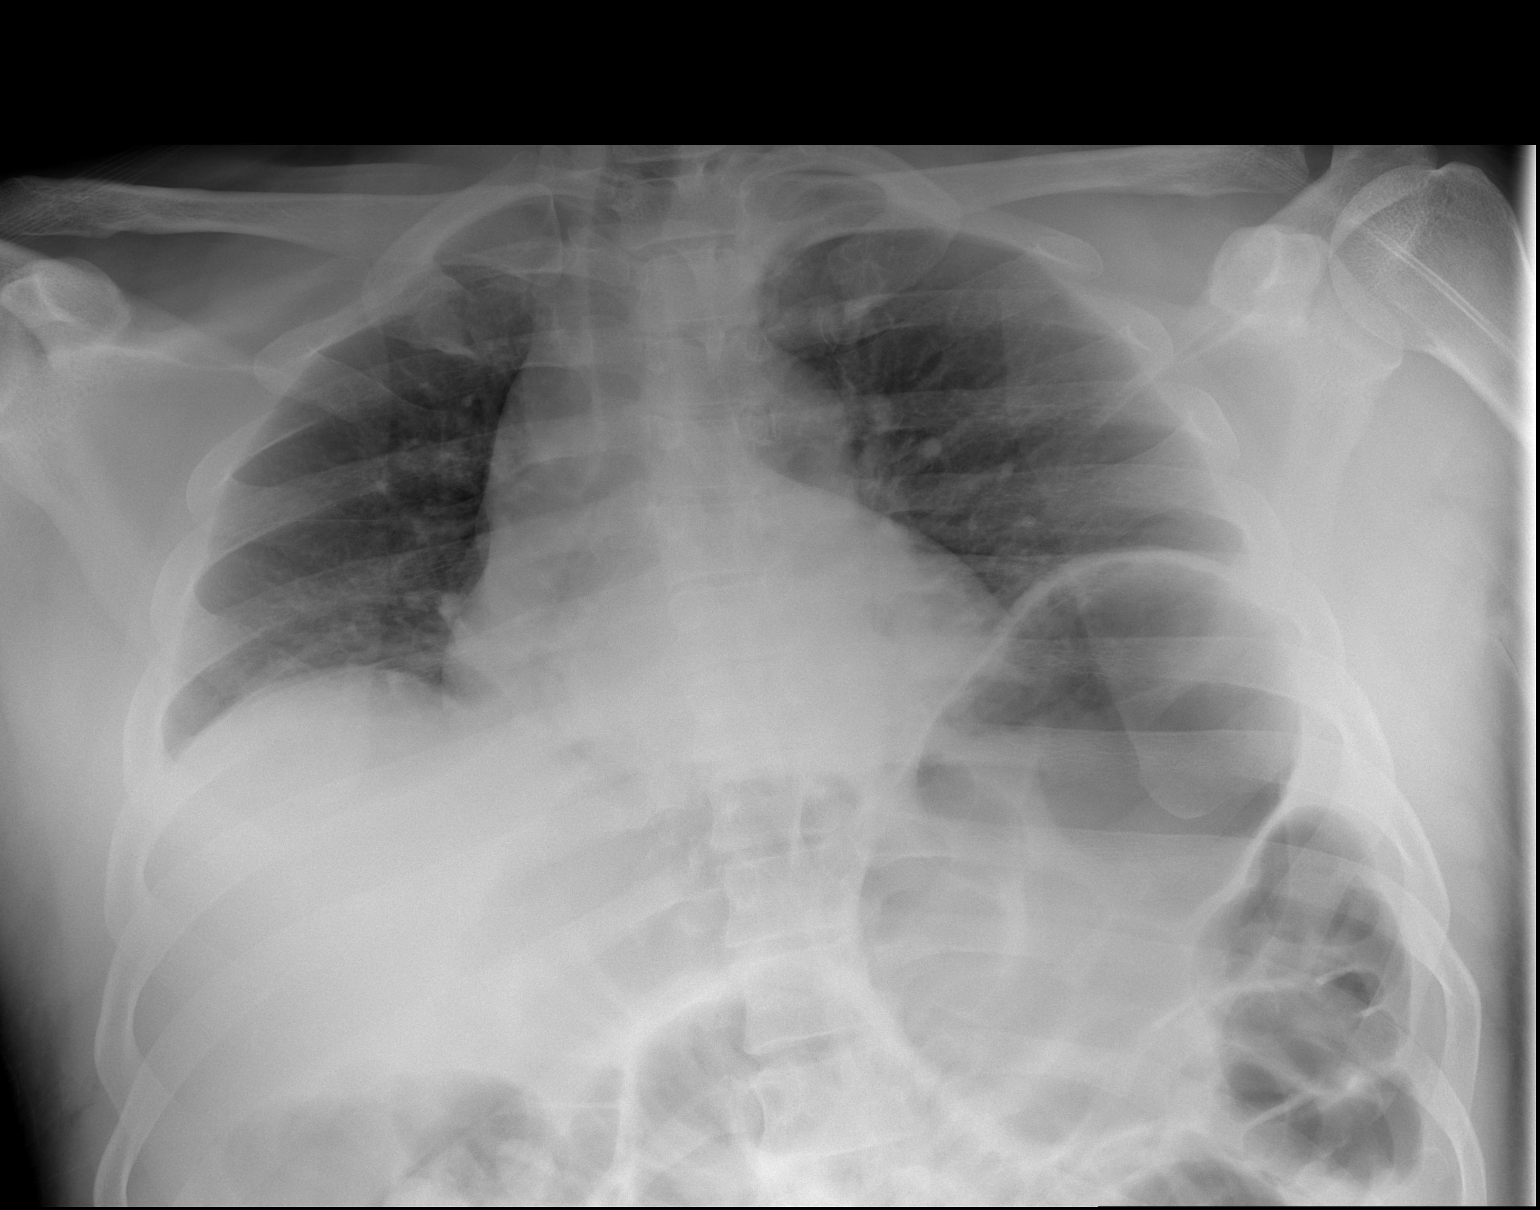

[2 of 2 positions shown; findings below may reference images not displayed]

FINDINGS: Low lung volumes are present.  The patient is in a semi-
erect position and this combined with low lung volumes accentuates
the cardiac contour but this is felt likely to be within normal
limits.  Ectasia of the thoracic aorta is suggested but may also be
accentuated by positioning.

The lung fields appear clear with the exception of crowding of
bronchovascular markings at both lung bases due to low lung
volumes.  No pleural fluid or focal infiltrates are seen.  No
pulmonary vascular congestion or pulmonary edema is noted.

The visualized portion of the bowel gas pattern reveals several
mildly dilated bowel loops in the upper abdomen and air fluid
levels on the lateral view. This can be more fully evaluated with
an abdominal series if felt clinically indicated.
IMPRESSION: Low lung volumes with crowding of bronchovascular markings at the
lung bases and no worrisome focal or acute abnormality suggested.

Mildly dilated small bowel loops with air fluid levels suggested in
the visualized portion of the abdomen. Full evaluation if indicated
clinically can be obtained with flat plate and upright views of the
abdomen.

## 2013-09-01 ENCOUNTER — Other Ambulatory Visit: Payer: Self-pay | Admitting: Family Medicine

## 2013-09-01 NOTE — Telephone Encounter (Signed)
Pt is wanting enough medication to last until 6/2 when has a appointment scheduled. jw

## 2013-09-05 ENCOUNTER — Ambulatory Visit: Payer: Self-pay | Admitting: Family Medicine

## 2013-09-20 ENCOUNTER — Ambulatory Visit: Payer: Self-pay | Admitting: Family Medicine

## 2014-03-28 ENCOUNTER — Other Ambulatory Visit: Payer: Self-pay | Admitting: Family Medicine

## 2014-03-28 NOTE — Telephone Encounter (Signed)
Patient requesting refill on amlodipine, has appointment schedlued to follow up on bp on 1/8 with PCP.

## 2014-04-11 ENCOUNTER — Ambulatory Visit: Payer: Self-pay | Admitting: Family Medicine

## 2014-04-13 ENCOUNTER — Ambulatory Visit: Payer: Self-pay | Admitting: Family Medicine

## 2014-04-13 ENCOUNTER — Other Ambulatory Visit: Payer: Self-pay | Admitting: Family Medicine

## 2014-04-13 DIAGNOSIS — I10 Essential (primary) hypertension: Secondary | ICD-10-CM

## 2014-04-13 MED ORDER — HYDROCHLOROTHIAZIDE 25 MG PO TABS: 12.5000 mg | ORAL_TABLET | Freq: Every day | ORAL | Status: DC

## 2014-10-09 ENCOUNTER — Other Ambulatory Visit: Payer: Self-pay | Admitting: Family Medicine

## 2014-10-09 MED ORDER — AMLODIPINE BESYLATE 10 MG PO TABS: 10.0000 mg | ORAL_TABLET | Freq: Every day | ORAL | Status: DC

## 2014-10-09 NOTE — Telephone Encounter (Signed)
Adam Wagner is calling because he would like a refill on amlodipine to last him until his next appointment on July 19th. Please inform the patient when/if completed. Thank you, Fonda Kinder, ASA

## 2014-10-12 ENCOUNTER — Telehealth: Payer: Self-pay | Admitting: *Deleted

## 2014-10-12 MED ORDER — AMLODIPINE BESYLATE 10 MG PO TABS: 10.0000 mg | ORAL_TABLET | Freq: Every day | ORAL | Status: DC

## 2014-10-12 NOTE — Telephone Encounter (Signed)
Thank you for doing that Barre

## 2014-10-12 NOTE — Telephone Encounter (Signed)
Pt returned call, he needs this med sent to [facility] outpatient pharmacy.  Resent. Fleeger, Salome Spotted

## 2014-10-15 NOTE — Telephone Encounter (Signed)
No problem Lukas Pelcher, Salome Spotted, [place]

## 2014-10-23 ENCOUNTER — Encounter: Payer: Self-pay | Admitting: Family Medicine

## 2014-10-23 ENCOUNTER — Ambulatory Visit (INDEPENDENT_AMBULATORY_CARE_PROVIDER_SITE_OTHER): Payer: Self-pay | Admitting: Family Medicine

## 2014-10-23 VITALS — BP 134/88 | HR 74 | Temp 98.3°F | Ht 69.0 in | Wt 221.0 lb

## 2014-10-23 DIAGNOSIS — R300 Dysuria: Secondary | ICD-10-CM

## 2014-10-23 DIAGNOSIS — N5239 Other post-surgical erectile dysfunction: Secondary | ICD-10-CM

## 2014-10-23 DIAGNOSIS — K5901 Slow transit constipation: Secondary | ICD-10-CM

## 2014-10-23 LAB — POCT URINALYSIS DIPSTICK
Bilirubin, UA: NEGATIVE
Blood, UA: NEGATIVE
Glucose, UA: NEGATIVE
KETONES UA: NEGATIVE
Leukocytes, UA: NEGATIVE
NITRITE UA: NEGATIVE
PROTEIN UA: NEGATIVE
UROBILINOGEN UA: 1
pH, UA: 6

## 2014-10-23 MED ORDER — SILDENAFIL CITRATE 100 MG PO TABS: 50.0000 mg | ORAL_TABLET | Freq: Every day | ORAL | Status: DC | PRN

## 2014-10-23 MED ORDER — POLYETHYLENE GLYCOL 3350 17 GM/SCOOP PO POWD: ORAL | Status: DC

## 2014-10-23 NOTE — Patient Instructions (Signed)
It was a pleasure seeing you today in our clinic. Today we discussed some urinary issues and numbness. Here is the treatment plan we have discussed and agreed upon together:   - I have ordered some labs for you to have drawn today. I will mail the results to you once I have obtained them. If there is anything I need to address then I will call you personally - I have sent you a prescription for viagra. You do not need to fill the entire prescription. Simply ask for 3-5 tablets and pay for only those. - If you'd like to be seen for a full physical exam then please schedule this up front

## 2014-10-24 DIAGNOSIS — K5900 Constipation, unspecified: Secondary | ICD-10-CM | POA: Insufficient documentation

## 2014-10-24 LAB — CBC
HEMATOCRIT: 39.1 % (ref 39.0–52.0)
Hemoglobin: 13.2 g/dL (ref 13.0–17.0)
MCH: 29.3 pg (ref 26.0–34.0)
MCHC: 33.8 g/dL (ref 30.0–36.0)
MCV: 86.7 fL (ref 78.0–100.0)
MPV: 10.7 fL (ref 8.6–12.4)
PLATELETS: 244 10*3/uL (ref 150–400)
RBC: 4.51 MIL/uL (ref 4.22–5.81)
RDW: 12.6 % (ref 11.5–15.5)
WBC: 4.9 10*3/uL (ref 4.0–10.5)

## 2014-10-24 LAB — BASIC METABOLIC PANEL WITH GFR
BUN: 13 mg/dL (ref 6–23)
CALCIUM: 9.9 mg/dL (ref 8.4–10.5)
CO2: 24 meq/L (ref 19–32)
CREATININE: 1.22 mg/dL (ref 0.50–1.35)
Chloride: 99 mEq/L (ref 96–112)
GFR, EST AFRICAN AMERICAN: 83 mL/min
GFR, EST NON AFRICAN AMERICAN: 72 mL/min
GLUCOSE: 79 mg/dL (ref 70–99)
POTASSIUM: 3.9 meq/L (ref 3.5–5.3)
Sodium: 138 mEq/L (ref 135–145)

## 2014-10-24 NOTE — Assessment & Plan Note (Addendum)
Patients ED may be psychological, or organic (or both). With his numbness persisting >10yrs I have little hope the numbness will resolve with time. Patient had been prescribed viagra in the past but had not filled this due to cost. I encouraged him to fill (or partially fill) this script and try a 1/2 tab, if this does not work, try a full tab. - Viagra

## 2014-10-24 NOTE — Assessment & Plan Note (Signed)
Patients LLQ pain likely due to constipation. Patient's history of abdominal surgery would also make pain from adhesions a possibility as well but this would likely be more of a chronic complaint - Miralax - patient encouraged to take this twice daily until stools are consistently loose and regular.

## 2014-10-24 NOTE — Progress Notes (Signed)
   HPI  CC: abdom pain Patient presents w/ complaints of LLQ pain. Pain is achy and intermittent. Regularly feels bloated. Been going on for the past month, but will occasionally go away. Patient is unsure if there is any relief w/ BMs. He reports having ~4 BMs per week. Diet consists of very LITTLE vegetables. Denies fever, chills, n/v/d, hematochezia/melena, or straining.  Patient has also been experiencing some erectile dysfunction. Reports this began shortly after he had his bladder rupture and surgery to repair. Endorses some numbness at/around the penis which he has been told was 2/2 the surgical repair. He is still able to obtain an erection. Some suprapubic tenderness. Endorses some vague intermittent dysuria. Denies hematuria, frequency, incontinence, edema, n/v/d, rash, or discharge. He believes some of this may be due to anxiety.  Review of Systems   See HPI for ROS. All other systems reviewed and are negative.  Past medical history and social history reviewed and updated in the EMR as appropriate.  Objective: BP 134/88 mmHg  Pulse 74  Temp(Src) 98.3 F (36.8 C) (Oral)  Ht 5\' 9"  (1.753 m)  Wt 221 lb (100.245 kg)  BMI 32.62 kg/m2 Gen: NAD, alert, pleasant. HEENT: NCAT, EOMI, PERRL CV: RRR, no murmur Resp: CTAB, no wheezes, non-labored Abd: S, ND, LLQ discomfort noted, possible stool burden appreciated w/ deep palpation. BS present, no guarding or organomegaly, midline surgical scar noted from umbilicus to pubis. Some suprapubic discomfort noted. Ext: No edema, warm, perfused, strength/sensation intact.  Neuro: Alert and oriented, Speech clear, No gross deficits  Assessment and plan:  Constipation Patients LLQ pain likely due to constipation. Patient's history of abdominal surgery would also make pain from adhesions a possibility as well but this would likely be more of a chronic complaint - Miralax - patient encouraged to take this twice daily until stools are consistently  loose and regular.  Erectile dysfunction Patients ED may be psychological, or organic (or both). With his numbness persisting >86yrs I have little hope the numbness will resolve with time. Patient had been prescribed viagra in the past but had not filled this due to cost. I encouraged him to fill (or partially fill) this script and try a 1/2 tab, if this does not work, try a full tab. - Viagra    Orders Placed This Encounter  Procedures  . BASIC METABOLIC PANEL WITH GFR  . CBC  . POCT urinalysis dipstick    Meds ordered this encounter  Medications  . sildenafil (VIAGRA) 100 MG tablet    Sig: Take 0.5-1 tablets (50-100 mg total) by mouth daily as needed for erectile dysfunction.    Dispense:  5 tablet    Refill:  11  . polyethylene glycol powder (GLYCOLAX/MIRALAX) powder    Sig: MIX 17 GRAMS IN LIQUID BY MOUTH DAILY    Dispense:  255 g    Refill:  0     Elberta Leatherwood, MD,MS,  PGY2 10/24/2014 10:38 PM

## 2014-10-29 ENCOUNTER — Encounter: Payer: Self-pay | Admitting: Family Medicine

## 2015-02-18 ENCOUNTER — Other Ambulatory Visit: Payer: Self-pay | Admitting: Family Medicine

## 2015-04-22 ENCOUNTER — Other Ambulatory Visit: Payer: Self-pay | Admitting: Family Medicine

## 2015-05-01 MED FILL — AMLODIPINE BESYLATE 10 MG T: 10 | 30 days supply | Qty: 30 | Fill #0

## 2015-05-09 ENCOUNTER — Other Ambulatory Visit: Payer: Self-pay | Admitting: Family Medicine

## 2015-05-11 ENCOUNTER — Other Ambulatory Visit: Payer: Self-pay | Admitting: Family Medicine

## 2015-05-13 ENCOUNTER — Other Ambulatory Visit: Payer: Self-pay | Admitting: *Deleted

## 2015-05-13 ENCOUNTER — Other Ambulatory Visit: Payer: Self-pay | Admitting: Family Medicine

## 2015-05-13 DIAGNOSIS — I10 Essential (primary) hypertension: Secondary | ICD-10-CM

## 2015-05-14 MED ORDER — HYDROCHLOROTHIAZIDE 25 MG PO TABS: 12.5000 mg | ORAL_TABLET | Freq: Every day | ORAL | Status: DC

## 2015-05-30 MED FILL — AMLODIPINE BESYLATE 10 MG T: 10 | 30 days supply | Qty: 30 | Fill #1

## 2015-07-05 MED FILL — AMLODIPINE BESYLATE 10 MG T: 10 | 30 days supply | Qty: 30 | Fill #2

## 2015-08-14 MED FILL — AMLODIPINE BESYLATE 10 MG T: 10 | 30 days supply | Qty: 30 | Fill #3

## 2015-10-10 MED FILL — AMLODIPINE BESYLATE 10 MG T: 10 | 30 days supply | Qty: 30 | Fill #4

## 2015-11-21 MED FILL — AMLODIPINE BESYLATE 10 MG T: 10 | 30 days supply | Qty: 30 | Fill #5

## 2015-12-23 ENCOUNTER — Other Ambulatory Visit: Payer: Self-pay | Admitting: Family Medicine

## 2015-12-24 MED FILL — AMLODIPINE BESYLATE 10 MG T: 10 | 30 days supply | Qty: 30 | Fill #0

## 2016-01-28 MED FILL — AMLODIPINE BESYLATE 10 MG T: 10 | 30 days supply | Qty: 30 | Fill #1

## 2016-03-02 MED FILL — AMLODIPINE BESYLATE 10 MG T: 10 | 90 days supply | Qty: 90 | Fill #2

## 2016-06-05 MED FILL — AMLODIPINE BESYLATE 10 MG T: 10 | 90 days supply | Qty: 90 | Fill #3

## 2016-07-13 ENCOUNTER — Telehealth: Payer: Self-pay | Admitting: Family Medicine

## 2016-07-13 NOTE — Telephone Encounter (Signed)
No answer, could not leave message. Has not been seen by PCP in over 2 years.

## 2016-10-14 MED FILL — AMLODIPINE BESYLATE 10 MG T: 10 | 90 days supply | Qty: 90 | Fill #4

## 2017-01-28 ENCOUNTER — Other Ambulatory Visit: Payer: Self-pay | Admitting: Family Medicine

## 2017-02-22 ENCOUNTER — Telehealth: Payer: Self-pay | Admitting: Internal Medicine

## 2017-02-22 DIAGNOSIS — I10 Essential (primary) hypertension: Secondary | ICD-10-CM

## 2017-02-22 NOTE — Telephone Encounter (Signed)
Pt called and needs his bp med refilled. He hasnt met his new PCP so I made him an appt to meet his new PCP and get meds refilled. He just needs enough of his bp meds to last him until that appt on 12/12. Please advise

## 2017-02-23 MED ORDER — HYDROCHLOROTHIAZIDE 25 MG PO TABS: 12.5000 mg | ORAL_TABLET | Freq: Every day | ORAL | 0 refills | Status: DC

## 2017-02-23 MED ORDER — AMLODIPINE BESYLATE 10 MG PO TABS: 10.0000 mg | ORAL_TABLET | Freq: Every day | ORAL | 0 refills | Status: DC

## 2017-02-23 NOTE — Telephone Encounter (Signed)
Ordered HCTZ and amlodipine without refills to [facility].   Olene Floss, MD [place], PGY-3

## 2017-03-04 MED ORDER — HYDROCHLOROTHIAZIDE 25 MG PO TABS: 12.5000 mg | ORAL_TABLET | Freq: Every day | ORAL | 0 refills | Status: DC

## 2017-03-04 MED ORDER — AMLODIPINE BESYLATE 10 MG PO TABS: 10.0000 mg | ORAL_TABLET | Freq: Every day | ORAL | 0 refills | Status: DC

## 2017-03-04 NOTE — Addendum Note (Signed)
Addended by: Darci Needle on: 03/04/2017 04:21 PM   Modules accepted: Orders

## 2017-03-04 NOTE — Telephone Encounter (Signed)
Placed refills to [facility].

## 2017-03-04 NOTE — Telephone Encounter (Signed)
Pt called back and said his pharmacy hasn't gotten the refill. Looks like it was sent to the wrong pharmacy. His pharmacy is the [place] [facility]. Please advise

## 2017-03-16 MED FILL — AMLODIPINE BESYLATE 10 MG T: 10 | 30 days supply | Qty: 30 | Fill #0

## 2017-03-17 ENCOUNTER — Ambulatory Visit: Payer: Self-pay | Admitting: Internal Medicine

## 2017-04-02 ENCOUNTER — Ambulatory Visit: Payer: Self-pay | Admitting: Internal Medicine

## 2017-06-17 ENCOUNTER — Other Ambulatory Visit: Payer: Self-pay

## 2017-06-17 ENCOUNTER — Telehealth: Payer: Self-pay

## 2017-06-17 DIAGNOSIS — I10 Essential (primary) hypertension: Secondary | ICD-10-CM

## 2017-06-17 MED ORDER — HYDROCHLOROTHIAZIDE 25 MG PO TABS: 12.5000 mg | ORAL_TABLET | Freq: Every day | ORAL | 0 refills | Status: DC

## 2017-06-17 MED ORDER — AMLODIPINE BESYLATE 10 MG PO TABS: 10.0000 mg | ORAL_TABLET | Freq: Every day | ORAL | 0 refills | Status: DC

## 2017-06-17 NOTE — Telephone Encounter (Signed)
Pt has appointment 07/01/17- needs refills of amlodipine and hctz sent to [facility] outpatient pharmacy, Pt call back [phone] Wallace Cullens, RN

## 2017-06-18 MED FILL — HYDROCHLOROTHIAZIDE 25 MG T: 25 | 60 days supply | Qty: 30 | Fill #0

## 2017-06-18 MED FILL — AMLODIPINE BESYLATE 10 MG T: 10 | 30 days supply | Qty: 30 | Fill #0

## 2017-06-18 NOTE — Telephone Encounter (Signed)
Created in error

## 2017-07-01 ENCOUNTER — Ambulatory Visit: Payer: Self-pay | Admitting: Internal Medicine

## 2017-07-07 ENCOUNTER — Telehealth: Payer: Self-pay

## 2017-07-07 NOTE — Telephone Encounter (Signed)
Patient left message asking what OTC cold meds he can take with her HTN meds. Call back is [phone]. Danley Danker, RN [facility] RN)

## 2017-07-08 NOTE — Telephone Encounter (Signed)
Called patient and advised him to avoid cough medications with decongestants like sudaphed. Suggested coricidin HBP. Also suggested he could also speak with pharmacist for guidance at local drug store.

## 2017-07-15 ENCOUNTER — Encounter: Payer: Self-pay | Admitting: Internal Medicine

## 2017-07-15 ENCOUNTER — Ambulatory Visit (INDEPENDENT_AMBULATORY_CARE_PROVIDER_SITE_OTHER): Payer: Self-pay | Admitting: Internal Medicine

## 2017-07-15 ENCOUNTER — Other Ambulatory Visit: Payer: Self-pay

## 2017-07-15 VITALS — BP 110/70 | HR 87 | Temp 98.5°F | Ht 69.0 in | Wt 220.4 lb

## 2017-07-15 DIAGNOSIS — D219 Benign neoplasm of connective and other soft tissue, unspecified: Secondary | ICD-10-CM

## 2017-07-15 DIAGNOSIS — N3289 Other specified disorders of bladder: Secondary | ICD-10-CM

## 2017-07-15 DIAGNOSIS — I10 Essential (primary) hypertension: Secondary | ICD-10-CM

## 2017-07-15 MED ORDER — HYDROCHLOROTHIAZIDE 25 MG PO TABS: 12.5000 mg | ORAL_TABLET | Freq: Every day | ORAL | 3 refills | Status: DC

## 2017-07-15 MED ORDER — AMLODIPINE BESYLATE 10 MG PO TABS: 10.0000 mg | ORAL_TABLET | Freq: Every day | ORAL | 3 refills | Status: DC

## 2017-07-15 MED ORDER — FLUTICASONE PROPIONATE 50 MCG/ACT NA SUSP: 2.0000 | Freq: Every day | NASAL | 6 refills | Status: AC

## 2017-07-15 MED FILL — AMLODIPINE BESYLATE 10 MG T: 10 | 90 days supply | Qty: 90 | Fill #0

## 2017-07-15 NOTE — Patient Instructions (Signed)
Mr. Kainz,  I will call with lab results.  Continue amlodipine 10 mg daily and hydrochlorothiazide 12.5 mg daily.   Don't pick your bump! It is likely scar tissue (a fibroma). If it grows or has pus, please see [place] back about it.  Try to get 150 minutes of moderate intensity exercise weekly to keep your heart healthy. This can also help with energy levels.  I will place referral to urology.  Best, Dr. Ola Spurr

## 2017-07-16 DIAGNOSIS — D219 Benign neoplasm of connective and other soft tissue, unspecified: Secondary | ICD-10-CM | POA: Insufficient documentation

## 2017-07-16 LAB — CBC
Hematocrit: 36 % — ABNORMAL LOW (ref 37.5–51.0)
Hemoglobin: 12.3 g/dL — ABNORMAL LOW (ref 13.0–17.7)
MCH: 29.3 pg (ref 26.6–33.0)
MCHC: 34.2 g/dL (ref 31.5–35.7)
MCV: 86 fL (ref 79–97)
Platelets: 252 10*3/uL (ref 150–379)
RBC: 4.2 x10E6/uL (ref 4.14–5.80)
RDW: 12.5 % (ref 12.3–15.4)
WBC: 4.7 10*3/uL (ref 3.4–10.8)

## 2017-07-16 LAB — COMPREHENSIVE METABOLIC PANEL
ALK PHOS: 104 IU/L (ref 39–117)
ALT: 18 IU/L (ref 0–44)
AST: 26 IU/L (ref 0–40)
Albumin/Globulin Ratio: 1.3 (ref 1.2–2.2)
Albumin: 4.1 g/dL (ref 3.5–5.5)
BUN / CREAT RATIO: 15 (ref 9–20)
BUN: 19 mg/dL (ref 6–24)
Bilirubin Total: 0.3 mg/dL (ref 0.0–1.2)
CALCIUM: 9.4 mg/dL (ref 8.7–10.2)
CO2: 26 mmol/L (ref 20–29)
CREATININE: 1.25 mg/dL (ref 0.76–1.27)
Chloride: 99 mmol/L (ref 96–106)
GFR calc Af Amer: 79 mL/min/{1.73_m2} (ref >59)
GFR, EST NON AFRICAN AMERICAN: 68 mL/min/{1.73_m2} (ref >59)
GLOBULIN, TOTAL: 3.2 g/dL (ref 1.5–4.5)
Glucose: 83 mg/dL (ref 65–99)
Potassium: 3.7 mmol/L (ref 3.5–5.2)
SODIUM: 138 mmol/L (ref 134–144)
Total Protein: 7.3 g/dL (ref 6.0–8.5)

## 2017-07-16 LAB — LIPID PANEL
CHOL/HDL RATIO: 2.5 ratio (ref 0.0–5.0)
Cholesterol, Total: 161 mg/dL (ref 100–199)
HDL: 64 mg/dL (ref >39)
LDL CALC: 71 mg/dL (ref 0–99)
Triglycerides: 130 mg/dL (ref 0–149)
VLDL Cholesterol Cal: 26 mg/dL (ref 5–40)

## 2017-07-16 NOTE — Assessment & Plan Note (Signed)
-   At goal of < 140/90 today. - Continue amlodipine 10 mg and hctz 12.5 mg daily. Provided refills.  - Will screen for HLD and will check electrolytes with CMP. - Recommended weight loss and regular physical activity.

## 2017-07-16 NOTE — Progress Notes (Signed)
[facility] Progress Note  Subjective:  Adam Wagner is a 47 y.o. male with history of HTN and bladder rupture of unknown etiology who presents for blood pressure check. He was seen at an outside ED a couple weeks ago for a right ankle fracture and had systolic blood pressure to 170s, per patient. He had been out of his blood pressure medication for about 4 days. He normally takes 12.5 mg hctz and 10 mg amlodipine. He has been taking his medication regularly since then after obtaining refills. He does have some concerns about sexual function--he says he can get an erection but it is "less vigorous" than prior to his bladder rupture and would like to follow-up with urology. He does not smoke. He has not been exercising but would like to start going to the gym again; he cites new hours at work as being a barrier. ROS: No headaches, no chest pain  No Known Allergies  Social History   Tobacco Use  . Smoking status: Never Smoker  . Smokeless tobacco: Never Used  Substance Use Topics  . Alcohol use: Yes    Comment: occassional    Objective: Blood pressure 110/70, pulse 87, temperature 98.5 F (36.9 C), temperature source Oral, height 5\' 9"  (1.753 m), weight 220 lb 6.4 oz (100 kg), SpO2 95 %. Body mass index is 32.55 kg/m. Constitutional: Muscular, overweight male in NAD HENT: Mild nasal congestion and swollen nasal turbinates.  Cardiovascular: RRR, S1, S2, no m/r/g.  Pulmonary/Chest: Effort normal and breath sounds normal.  Musculoskeletal: No LE edema Neurological: AOx3, no focal deficits. Skin: Skin is warm and dry. Pea-sized firm nodule near base of anterior right rib cage (says present for > 1 year and has not grown; unknown if ever had bite but does pick at it occasionally) Psychiatric: Normal mood and affect.  Vitals reviewed  Assessment/Plan: HTN (hypertension) - At goal of < 140/90 today. - Continue amlodipine 10 mg and hctz 12.5 mg daily. Provided refills.  -  Will screen for HLD and will check electrolytes with CMP. - Recommended weight loss and regular physical activity.  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 No drainage to suggest infection or growth to raise concern for tumor. - Advised patient to monitor for changes.   Follow-up prn.   Olene Floss, MD [place], PGY-3

## 2017-07-16 NOTE — Assessment & Plan Note (Signed)
-   Likely scarring to unknown small trauma. No drainage to suggest infection or growth to raise concern for tumor. - Advised patient to monitor for changes.

## 2017-07-16 NOTE — Assessment & Plan Note (Signed)
-   Referred to Urology per patient request

## 2017-07-19 ENCOUNTER — Encounter: Payer: Self-pay | Admitting: Internal Medicine

## 2017-08-12 MED FILL — HYDROCHLOROTHIAZIDE 25 MG T: 25 | 60 days supply | Qty: 30 | Fill #0

## 2017-11-23 MED FILL — HYDROCHLOROTHIAZIDE 25 MG T: 25 | 90 days supply | Qty: 45 | Fill #0

## 2017-12-16 MED FILL — AMLODIPINE BESYLATE 10 MG T: 10 | 90 days supply | Qty: 90 | Fill #1

## 2018-02-25 MED FILL — HYDROCHLOROTHIAZIDE 25 MG T: 25 | 90 days supply | Qty: 45 | Fill #1

## 2018-05-05 MED FILL — AMLODIPINE BESYLATE 10 MG T: 10 | 90 days supply | Qty: 90 | Fill #2

## 2018-06-09 MED FILL — HYDROCHLOROTHIAZIDE 25 MG T: 25 | 90 days supply | Qty: 45 | Fill #2

## 2018-08-08 ENCOUNTER — Other Ambulatory Visit: Payer: Self-pay | Admitting: Internal Medicine

## 2018-08-08 DIAGNOSIS — I10 Essential (primary) hypertension: Secondary | ICD-10-CM

## 2018-08-09 NOTE — Telephone Encounter (Signed)
Patient scheduled.  Jazmin Hartsell,CMA

## 2018-08-09 NOTE — Telephone Encounter (Signed)
Needs BP follow up. Would prefer in person but can do virtual if patient has ability to check BP at home or is willing to come in for nurse/lab visit.

## 2018-08-11 ENCOUNTER — Ambulatory Visit: Payer: Self-pay | Admitting: Family Medicine

## 2018-08-18 MED FILL — HYDROCHLOROTHIAZIDE 25 MG T: 25 | 30 days supply | Qty: 15 | Fill #0

## 2018-08-18 MED FILL — AMLODIPINE BESYLATE 10 MG T: 10 | 30 days supply | Qty: 30 | Fill #0

## 2018-09-28 ENCOUNTER — Other Ambulatory Visit: Payer: Self-pay | Admitting: Family Medicine

## 2018-09-28 DIAGNOSIS — I10 Essential (primary) hypertension: Secondary | ICD-10-CM

## 2018-09-28 MED FILL — AMLODIPINE BESYLATE 10 MG T: 10 | 30 days supply | Qty: 30 | Fill #0

## 2018-09-28 MED FILL — HYDROCHLOROTHIAZIDE 25 MG T: 25 | 30 days supply | Qty: 15 | Fill #0

## 2018-09-28 NOTE — Telephone Encounter (Signed)
Attempted to call patient.  Will try again tomorrow. Jazmin Hartsell,CMA

## 2018-09-28 NOTE — Telephone Encounter (Signed)
Needs in person visit for BP check and labs. 30 day refill given. Please inform patient.

## 2018-09-29 NOTE — Telephone Encounter (Signed)
Patient informed and appt made for 10-03-2018. Jazmin Hartsell,CMA

## 2018-09-29 NOTE — Telephone Encounter (Signed)
Attempted to call patient again.  Mailbox is full.  Will try one more time later today and then will send a letter if I can't reach him.  Renu Asby,CMA

## 2018-10-03 ENCOUNTER — Ambulatory Visit: Payer: BLUE CROSS/BLUE SHIELD | Admitting: Family Medicine

## 2018-10-22 ENCOUNTER — Emergency Department (HOSPITAL_COMMUNITY)
Admission: EM | Admit: 2018-10-22 | Discharge: 2018-10-22 | Disposition: A | Discharge status: Home / Self Care | Payer: BC Managed Care – PPO | Attending: Emergency Medicine | Admitting: Emergency Medicine

## 2018-10-22 ENCOUNTER — Other Ambulatory Visit: Payer: Self-pay

## 2018-10-22 ENCOUNTER — Encounter (HOSPITAL_COMMUNITY): Payer: Self-pay | Admitting: Emergency Medicine

## 2018-10-22 DIAGNOSIS — R609 Edema, unspecified: Secondary | ICD-10-CM

## 2018-10-22 DIAGNOSIS — Z79899 Other long term (current) drug therapy: Secondary | ICD-10-CM | POA: Diagnosis not present

## 2018-10-22 DIAGNOSIS — N4889 Other specified disorders of penis: Secondary | ICD-10-CM | POA: Diagnosis not present

## 2018-10-22 DIAGNOSIS — R1030 Lower abdominal pain, unspecified: Secondary | ICD-10-CM | POA: Diagnosis present

## 2018-10-22 NOTE — Discharge Instructions (Addendum)
It was my pleasure taking care of you today!   Please call your primary care doctor on Monday to schedule a follow up appointment.   Take all your medications as directed.   Return to ER if swelling returns, new or worsening symptoms, any additional concerns.

## 2018-10-22 NOTE — ED Triage Notes (Signed)
Pt reports "irritation" to groin area after he wore jogger pants without underwear. States that he also used Axe body spray which has caused dryness in the past.

## 2018-10-22 NOTE — ED Provider Notes (Signed)
[facility] EMERGENCY DEPARTMENT Provider Note   CSN: [id number] Arrival date & time: 10/22/18  1725     History   Chief Complaint Chief Complaint  Patient presents with  . Groin Pain    HPI Adam Wagner is a 48 y.o. male.     The history is provided by the patient and medical records. No language interpreter was used.  Groin Pain Pertinent negatives include no abdominal pain.   Adam Wagner is a 48 y.o. male  with a PMH of urinary retention, erectile dysfunction, hematuria, bladder rupture who presents to the Emergency Department complaining of swelling around his penis that he noticed earlier today.  He denies any type of pain.  No wounds or lesions.  No penile discharge.  He reports being on hydrochlorothiazide.  He missed the last 2 days.  He does have swelling to this area when he has not taken his medication.  He took it just a little while ago before coming to the ER.  During the exam, he actually states that he feels as a follow-up the swelling is gone now.  He tells me that he is sorry, but he just kind of panicked when his scrotum started to swell.  After looking at the area, realizing the swelling was down, he tells me that he has no complaints.  Past Medical History:  Diagnosis Date  . Hematuria 05/09/2012    Patient Active Problem List   Diagnosis Date Noted  . Fibroma 07/16/2017  . Constipation 10/24/2014  . Erectile dysfunction 08/23/2012  . HTN (hypertension) 07/01/2012  . Pain, dental 07/01/2012  . Acute urinary retention 05/13/2012  . Bladder rupture 05/13/2012  . Hematuria 05/13/2012  . Postoperative ileus ([place]) 05/13/2012    Past Surgical History:  Procedure Laterality Date  . LAPAROTOMY  05/10/2012   Procedure: EXPLORATORY LAPAROTOMY;  Surgeon: Molli Hazard, MD;  Location: [facility];  Service: Urology;  Laterality: N/A;  exploratory laparotomy for bladder perforation, cystoscopy  . NO PAST SURGERIES          Home  Medications    Prior to Admission medications   Medication Sig Start Date End Date Taking? Authorizing Provider  amLODipine (NORVASC) 10 MG tablet TAKE 1 TABLET BY MOUTH DAILY. 09/28/18   Bufford Lope, DO  fluticasone (FLONASE) 50 MCG/ACT nasal spray Place 2 sprays into both nostrils daily. 07/15/17   Rogue Bussing, MD  hydrochlorothiazide (HYDRODIURIL) 25 MG tablet TAKE 1/2 TABLET BY MOUTH DAILY. 09/28/18   Bufford Lope, DO  polyethylene glycol powder (GLYCOLAX/MIRALAX) powder MIX 17 GRAMS IN LIQUID BY MOUTH DAILY 02/18/15   McKeag, Marylynn Pearson, MD    Family History No family history on file.  Social History Social History   Tobacco Use  . Smoking status: Never Smoker  . Smokeless tobacco: Never Used  Substance Use Topics  . Alcohol use: Yes    Comment: occassional  . Drug use: No     Allergies   Patient has no known allergies.   Review of Systems Review of Systems  Gastrointestinal: Negative for abdominal pain, nausea and vomiting.  Genitourinary: Positive for penile swelling and scrotal swelling. Negative for difficulty urinating, discharge, dysuria, flank pain, frequency, genital sores, hematuria, penile pain, testicular pain and urgency.     Physical Exam Updated Vital Signs BP 134/90 (BP Location: Right Arm)   Pulse 76   Temp 98.2 F (36.8 C) (Oral)   Resp 16   SpO2 97%   Physical Exam  Vitals signs and nursing note reviewed.  Constitutional:      General: He is not in acute distress.    Appearance: He is well-developed.  HENT:     Head: Normocephalic and atraumatic.  Neck:     Musculoskeletal: Neck supple.  Cardiovascular:     Rate and Rhythm: Normal rate and regular rhythm.     Heart sounds: Normal heart sounds. No murmur.  Pulmonary:     Effort: Pulmonary effort is normal. No respiratory distress.     Breath sounds: Normal breath sounds.  Abdominal:     General: There is no distension.     Palpations: Abdomen is soft.     Comments: No  abdominal tenderness.  Genitourinary:    Comments: No penile or scrotal swelling.  No tenderness either.  Uncircumcised.  No discharge from penis. No signs of lesion or erythema on the penis or testicles. No testicular masses. No signs of any inguinal hernias. Cremaster reflex present bilaterally.  Normal GU exam.  Skin:    General: Skin is warm and dry.  Neurological:     Mental Status: He is alert and oriented to person, place, and time.      ED Treatments / Results  Labs (all labs ordered are listed, but only abnormal results are displayed) Labs Reviewed - No data to display  EKG None  Radiology No results found.  Procedures Procedures (including critical care time)  Medications Ordered in ED Medications - No data to display   Initial Impression / Assessment and Plan / ED Course  I have reviewed the triage vital signs and the nursing notes.  Pertinent labs & imaging results that were available during my care of the patient were reviewed by me and considered in my medical decision making (see chart for details).        Adam Wagner is a 48 y.o. male who presents to ED for evaluation of penile and scrotal swelling that has resolved since coming to the emergency department.  He does have complex urologic history which was reviewed.  He is on hydrochlorothiazide daily.  He missed last 2 days and started having some GU swelling.  He took his medication this morning and decided to come to the emergency department.  GU exam is completely normal.  There is no swelling or tenderness.  Patient actually laughed, stating that he feels silly because it is all better now.  I encouraged him to follow-up with his PCP and continue to take his medications as directed.  He should return to the emergency department or see PCP urgently should his symptoms return.  All questions were answered.   Final Clinical Impressions(s) / ED Diagnoses   Final diagnoses:  Swelling    ED Discharge  Orders    None       Ward, Ozella Almond, PA-C 10/22/18 1912    Daleen Bo, MD 10/24/18 [phone]

## 2018-10-22 NOTE — ED Notes (Signed)
Patient verbalizes understanding of discharge instructions. Opportunity for questioning and answers were provided. Armband removed by staff, pt discharged from ED.  

## 2018-11-03 ENCOUNTER — Other Ambulatory Visit: Payer: Self-pay

## 2018-11-03 DIAGNOSIS — I10 Essential (primary) hypertension: Secondary | ICD-10-CM

## 2018-11-03 MED ORDER — AMLODIPINE BESYLATE 10 MG PO TABS: 10.0000 mg | ORAL_TABLET | Freq: Every day | ORAL | 1 refills | Status: DC

## 2018-11-03 MED ORDER — HYDROCHLOROTHIAZIDE 25 MG PO TABS: 12.5000 mg | ORAL_TABLET | Freq: Every day | ORAL | 1 refills | Status: DC

## 2018-11-03 MED FILL — HYDROCHLOROTHIAZIDE 25 MG T: 25 | 30 days supply | Qty: 15 | Fill #0

## 2018-11-03 MED FILL — AMLODIPINE BESYLATE 10 MG T: 10 | 30 days supply | Qty: 30 | Fill #0

## 2018-12-08 MED FILL — AMLODIPINE BESYLATE 10 MG T: 10 | 30 days supply | Qty: 30 | Fill #1

## 2018-12-08 MED FILL — HYDROCHLOROTHIAZIDE 25 MG T: 25 | 30 days supply | Qty: 15 | Fill #1

## 2019-01-19 ENCOUNTER — Other Ambulatory Visit: Payer: Self-pay | Admitting: Family Medicine

## 2019-01-19 DIAGNOSIS — I10 Essential (primary) hypertension: Secondary | ICD-10-CM

## 2019-01-23 MED FILL — AMLODIPINE BESYLATE 10 MG T: 10 | 30 days supply | Qty: 30 | Fill #0

## 2019-01-23 MED FILL — HYDROCHLOROTHIAZIDE 25 MG T: 25 | 30 days supply | Qty: 15 | Fill #0

## 2019-04-12 ENCOUNTER — Other Ambulatory Visit: Payer: Self-pay | Admitting: Family Medicine

## 2019-04-12 DIAGNOSIS — I10 Essential (primary) hypertension: Secondary | ICD-10-CM

## 2019-04-17 ENCOUNTER — Other Ambulatory Visit: Payer: Self-pay | Admitting: Family Medicine

## 2019-04-17 DIAGNOSIS — I10 Essential (primary) hypertension: Secondary | ICD-10-CM

## 2019-04-19 ENCOUNTER — Other Ambulatory Visit: Payer: Self-pay

## 2019-04-19 DIAGNOSIS — I10 Essential (primary) hypertension: Secondary | ICD-10-CM

## 2019-04-19 NOTE — Telephone Encounter (Signed)
Pt calls nurse line requesting refills on BP medications. Per patient he has scheduled the first available appointment with PCP and wants to know if he can have a refill to last him until appointment.   Appointment scheduled for 05/26/19.   To PCP  Please advise.  Talbot Grumbling, RN

## 2019-04-20 ENCOUNTER — Other Ambulatory Visit: Payer: Self-pay | Admitting: Family Medicine

## 2019-04-20 DIAGNOSIS — I10 Essential (primary) hypertension: Secondary | ICD-10-CM

## 2019-04-25 ENCOUNTER — Telehealth: Payer: Self-pay

## 2019-04-25 ENCOUNTER — Other Ambulatory Visit: Payer: Self-pay | Admitting: Family Medicine

## 2019-04-25 DIAGNOSIS — I10 Essential (primary) hypertension: Secondary | ICD-10-CM

## 2019-04-25 MED ORDER — HYDROCHLOROTHIAZIDE 25 MG PO TABS: 12.5000 mg | ORAL_TABLET | Freq: Every day | ORAL | 0 refills | Status: DC

## 2019-04-25 MED ORDER — AMLODIPINE BESYLATE 10 MG PO TABS: 10.0000 mg | ORAL_TABLET | Freq: Every day | ORAL | 0 refills | Status: DC

## 2019-04-25 MED FILL — HYDROCHLOROTHIAZIDE 25 MG T: 25 | 30 days supply | Qty: 15 | Fill #0

## 2019-04-25 MED FILL — AMLODIPINE BESYLATE 10 MG T: 10 | 30 days supply | Qty: 30 | Fill #0

## 2019-04-25 NOTE — Telephone Encounter (Signed)
Pt calls nurse line regarding refill of BP medication. Patient has been without medications for approximately 3 weeks. Pt would like to get enough refill to get him to his f/u appointment.   To PCP  Please advise   Talbot Grumbling, RN

## 2019-04-25 NOTE — Telephone Encounter (Signed)
Rx sent into wrong pharmacy and type was printed and not e-prescribed. Updated changed and e-prescribed to correct pharmacy.   Talbot Grumbling, RN

## 2019-05-26 ENCOUNTER — Ambulatory Visit: Payer: BC Managed Care – PPO | Admitting: Family Medicine

## 2019-06-08 ENCOUNTER — Ambulatory Visit: Payer: Self-pay | Admitting: Family Medicine

## 2019-06-15 ENCOUNTER — Other Ambulatory Visit: Payer: Self-pay | Admitting: Family Medicine

## 2019-06-15 DIAGNOSIS — I10 Essential (primary) hypertension: Secondary | ICD-10-CM

## 2019-06-15 NOTE — Telephone Encounter (Signed)
Pt has an appt on 3/31. Would you send in enough medications until he come to the appt? Please advise. Ottis Stain, CMA

## 2019-06-16 MED FILL — AMLODIPINE BESYLATE 10 MG T: 10 | 30 days supply | Qty: 30 | Fill #0

## 2019-06-16 MED FILL — HYDROCHLOROTHIAZIDE 25 MG T: 25 | 30 days supply | Qty: 15 | Fill #0

## 2019-07-05 ENCOUNTER — Ambulatory Visit: Payer: Self-pay | Admitting: Family Medicine

## 2019-08-03 ENCOUNTER — Other Ambulatory Visit: Payer: Self-pay | Admitting: Family Medicine

## 2019-08-03 DIAGNOSIS — I10 Essential (primary) hypertension: Secondary | ICD-10-CM

## 2019-08-10 ENCOUNTER — Ambulatory Visit: Payer: Self-pay | Admitting: Family Medicine

## 2019-08-10 ENCOUNTER — Telehealth: Payer: Self-pay

## 2019-08-10 NOTE — Telephone Encounter (Signed)
Opened in error.   Wyatt Thorstenson C Joshua Zeringue, RN  

## 2019-08-10 NOTE — Telephone Encounter (Signed)
Patient has had 4 no shows for appointments or cancellations within 24 hours since January 2021. He requires an annual exam and lab work for more refills, which has been made clear to the patient. He currently has an appointment scheduled for 08/17/19 as he had transportation issues to appointment today. I will refill prescriptions through 08/17/19 only. If patient does not attend this appointment, I will not provide more refills until he is seen.   Gladys Damme, MD

## 2019-08-10 NOTE — Telephone Encounter (Signed)
Patient calls nurse line requesting refills on blood pressure medication. Patient was originally scheduled for OV today, however, was unable to keep due to being delayed in [place]. Patient rescheduled appointment, and is requesting that rx be sent in to get him through until scheduled appointment.   To PCP  Talbot Grumbling, RN

## 2019-08-11 MED FILL — HYDROCHLOROTHIAZIDE 25 MG T: 25 | 8 days supply | Qty: 4 | Fill #0

## 2019-08-11 MED FILL — AMLODIPINE BESYLATE 10 MG T: 10 | 8 days supply | Qty: 8 | Fill #0

## 2019-08-17 ENCOUNTER — Ambulatory Visit: Payer: Self-pay | Admitting: Family Medicine

## 2019-08-17 ENCOUNTER — Other Ambulatory Visit: Payer: Self-pay

## 2019-08-17 DIAGNOSIS — I10 Essential (primary) hypertension: Secondary | ICD-10-CM

## 2019-08-17 NOTE — Telephone Encounter (Signed)
Patient calls nurse line stating there was a "mix up" yesterday with his apt. Patient stated he had to reschedule for 5/20. Patient is out of his medication. Patient is requesting a weeks worth of BP meds to be sent to [facility] outpatient pharmacy. Patient did state that a 1 months supply costs him the same as a 1 weeks supply. I advised patient he has missed several apts and has not been seen since 2019, therefore the request may be denied. Patient was appreciative and understood. Patient plans to come to 5/20 apt with [facility]. Will forward to PCP.

## 2019-08-24 ENCOUNTER — Other Ambulatory Visit: Payer: Self-pay | Admitting: Family Medicine

## 2019-08-24 ENCOUNTER — Other Ambulatory Visit: Payer: Self-pay

## 2019-08-24 ENCOUNTER — Ambulatory Visit (INDEPENDENT_AMBULATORY_CARE_PROVIDER_SITE_OTHER): Payer: Self-pay | Admitting: Family Medicine

## 2019-08-24 ENCOUNTER — Encounter: Payer: Self-pay | Admitting: Family Medicine

## 2019-08-24 VITALS — BP 128/92 | HR 68 | Ht 69.0 in | Wt 209.8 lb

## 2019-08-24 DIAGNOSIS — I10 Essential (primary) hypertension: Secondary | ICD-10-CM

## 2019-08-24 MED ORDER — AMLODIPINE BESYLATE 10 MG PO TABS: 10.0000 mg | ORAL_TABLET | Freq: Every day | ORAL | 3 refills | Status: DC

## 2019-08-24 MED ORDER — HYDROCHLOROTHIAZIDE 12.5 MG PO TABS: 12.5000 mg | ORAL_TABLET | Freq: Every day | ORAL | 3 refills | Status: AC

## 2019-08-24 NOTE — Progress Notes (Signed)
Subjective:    Patient here for follow-up of elevated blood pressure.  He is exercising and is adherent to a low-salt diet.  Blood pressure is well controlled at home. Cardiac symptoms: none. Patient denies: chest pain, chest pressure/discomfort, claudication, dyspnea and lower extremity edema. Cardiovascular risk factors: hypertension. Use of agents associated with hypertension: none. History of target organ damage: none.  The following portions of the patient's history were reviewed and updated as appropriate: allergies, current medications, past family history, past medical history, past social history, past surgical history and problem list.  Review of Systems Pertinent items noted in HPI and remainder of comprehensive ROS otherwise negative.      Objective:   Vitals:   08/24/19 1633  BP: (!) 128/92  Pulse: 68  SpO2: 99%   Gen: NAD, resting comfortably CV: RRR with no murmurs appreciated Pulm: NWOB, CTAB with no crackles, wheezes, or rhonchi GI:  Soft, Nontender, Nondistended. MSK: no edema, cyanosis, or clubbing noted Skin: warm, dry Neuro: grossly normal, moves all extremities Psych: Normal affect and thought content       Assessment:    Essential Hypertension  Plan:  Continue HCTZ and amlodipine Check BMP, Alb/Cr ratio, lipid, A1C

## 2019-08-24 NOTE — Patient Instructions (Signed)

## 2019-08-25 LAB — BASIC METABOLIC PANEL
BUN/Creatinine Ratio: 15 (ref 9–20)
BUN: 17 mg/dL (ref 6–24)
CO2: 25 mmol/L (ref 20–29)
Calcium: 9.7 mg/dL (ref 8.7–10.2)
Chloride: 102 mmol/L (ref 96–106)
Creatinine, Ser: 1.1 mg/dL (ref 0.76–1.27)
GFR calc Af Amer: 91 mL/min/{1.73_m2} (ref >59)
GFR calc non Af Amer: 78 mL/min/{1.73_m2} (ref >59)
Glucose: 73 mg/dL (ref 65–99)
Potassium: 4.1 mmol/L (ref 3.5–5.2)
Sodium: 140 mmol/L (ref 134–144)

## 2019-08-25 LAB — LIPID PANEL
Chol/HDL Ratio: 3.1 ratio (ref 0.0–5.0)
Cholesterol, Total: 169 mg/dL (ref 100–199)
HDL: 55 mg/dL (ref >39)
LDL Chol Calc (NIH): 89 mg/dL (ref 0–99)
Triglycerides: 144 mg/dL (ref 0–149)
VLDL Cholesterol Cal: 25 mg/dL (ref 5–40)

## 2019-08-25 LAB — MICROALBUMIN / CREATININE URINE RATIO
Creatinine, Urine: 209.2 mg/dL
Microalb/Creat Ratio: 3 mg/g creat (ref 0–29)
Microalbumin, Urine: 5.3 ug/mL

## 2019-08-25 LAB — HEMOGLOBIN A1C
Est. average glucose Bld gHb Est-mCnc: 94 mg/dL
Hgb A1c MFr Bld: 4.9 % (ref 4.8–5.6)

## 2019-08-28 ENCOUNTER — Other Ambulatory Visit: Payer: Self-pay | Admitting: Family Medicine

## 2019-08-28 ENCOUNTER — Encounter: Payer: Self-pay | Admitting: Family Medicine

## 2019-08-29 MED FILL — AMLODIPINE BESYLATE 10 MG T: 10 | 90 days supply | Qty: 90 | Fill #0

## 2019-08-29 MED FILL — HYDROCHLOROTHIAZIDE 25 MG T: 25 | 90 days supply | Qty: 45 | Fill #0

## 2019-08-30 ENCOUNTER — Encounter: Payer: Self-pay | Admitting: Family Medicine

## 2019-09-08 ENCOUNTER — Ambulatory Visit: Payer: Self-pay | Admitting: Family Medicine

## 2019-12-08 MED FILL — HYDROCHLOROTHIAZIDE 25 MG T: 25 | 90 days supply | Qty: 45 | Fill #1

## 2019-12-21 MED FILL — AMLODIPINE BESYLATE 10 MG T: 10 | 90 days supply | Qty: 90 | Fill #1

## 2020-03-25 MED FILL — HYDROCHLOROTHIAZIDE 25 MG T: 25 | 90 days supply | Qty: 45 | Fill #2

## 2020-04-06 DEATH — deceased
